# Patient Record
Sex: Female | Born: 1985 | Race: Black or African American | Hispanic: No | Smoking: Never smoker
Health system: Southern US, Community
[De-identification: ages and names within clinical notes are randomized; demographics above are authoritative.]

## PROBLEM LIST (undated history)

## (undated) DIAGNOSIS — J45909 Unspecified asthma, uncomplicated: Secondary | ICD-10-CM

## (undated) DIAGNOSIS — I10 Essential (primary) hypertension: Secondary | ICD-10-CM

---

## 2010-11-15 ENCOUNTER — Ambulatory Visit (INDEPENDENT_AMBULATORY_CARE_PROVIDER_SITE_OTHER): Payer: Medicaid Other

## 2010-11-15 ENCOUNTER — Inpatient Hospital Stay (INDEPENDENT_AMBULATORY_CARE_PROVIDER_SITE_OTHER)
Admission: RE | Admit: 2010-11-15 | Discharge: 2010-11-15 | Disposition: A | Discharge status: Home / Self Care | Payer: Self-pay | Source: Ambulatory Visit | Attending: Family Medicine | Admitting: Family Medicine

## 2010-11-15 DIAGNOSIS — J40 Bronchitis, not specified as acute or chronic: Secondary | ICD-10-CM

## 2011-02-12 ENCOUNTER — Inpatient Hospital Stay (INDEPENDENT_AMBULATORY_CARE_PROVIDER_SITE_OTHER)
Admission: RE | Admit: 2011-02-12 | Discharge: 2011-02-12 | Disposition: A | Discharge status: Home / Self Care | Payer: Medicaid Other | Source: Ambulatory Visit | Attending: Family Medicine | Admitting: Family Medicine

## 2011-02-12 DIAGNOSIS — R0789 Other chest pain: Secondary | ICD-10-CM

## 2011-12-17 ENCOUNTER — Encounter (HOSPITAL_COMMUNITY): Payer: Self-pay | Admitting: *Deleted

## 2011-12-17 ENCOUNTER — Emergency Department (HOSPITAL_COMMUNITY): Payer: Medicaid Other

## 2011-12-17 ENCOUNTER — Emergency Department (HOSPITAL_COMMUNITY)
Admission: EM | Admit: 2011-12-17 | Discharge: 2011-12-17 | Disposition: A | Discharge status: Home / Self Care | Payer: Medicaid Other | Attending: Emergency Medicine | Admitting: Emergency Medicine

## 2011-12-17 DIAGNOSIS — J705 Respiratory conditions due to smoke inhalation: Secondary | ICD-10-CM | POA: Insufficient documentation

## 2011-12-17 DIAGNOSIS — R071 Chest pain on breathing: Secondary | ICD-10-CM | POA: Insufficient documentation

## 2011-12-17 DIAGNOSIS — I10 Essential (primary) hypertension: Secondary | ICD-10-CM | POA: Insufficient documentation

## 2011-12-17 DIAGNOSIS — G8929 Other chronic pain: Secondary | ICD-10-CM | POA: Insufficient documentation

## 2011-12-17 HISTORY — DX: Essential (primary) hypertension: I10

## 2011-12-17 MED ORDER — IBUPROFEN 800 MG PO TABS: 800.0000 mg | ORAL_TABLET | Freq: Three times a day (TID) | ORAL | Status: AC

## 2011-12-17 MED ORDER — IBUPROFEN 800 MG PO TABS
800.0000 mg | ORAL_TABLET | Freq: Once | ORAL | Status: AC
  Administered 2011-12-17: 800 mg via ORAL
  Filled 2011-12-17: qty 1
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
## 2011-12-19 NOTE — ED Provider Notes (Signed)
Medical screening examination/treatment/procedure(s) were performed by non-physician practitioner and as supervising physician I was immediately available for consultation/collaboration.  Juliet Rude. Rubin Payor, MD 12/19/11 1610

## 2012-09-25 ENCOUNTER — Encounter (HOSPITAL_COMMUNITY): Payer: Self-pay | Admitting: Emergency Medicine

## 2012-09-25 ENCOUNTER — Emergency Department (HOSPITAL_COMMUNITY)
Admission: EM | Admit: 2012-09-25 | Discharge: 2012-09-25 | Disposition: A | Discharge status: Home / Self Care | Payer: Managed Care, Other (non HMO) | Source: Home / Self Care | Attending: Family Medicine | Admitting: Family Medicine

## 2012-09-25 DIAGNOSIS — K219 Gastro-esophageal reflux disease without esophagitis: Secondary | ICD-10-CM

## 2012-09-25 MED ORDER — OMEPRAZOLE-SODIUM BICARBONATE 40-1100 MG PO CAPS: 1.0000 | ORAL_CAPSULE | Freq: Every day | ORAL | Status: DC

## 2012-09-25 MED ORDER — SUCRALFATE 1 GM/10ML PO SUSP
1.0000 g | Freq: Four times a day (QID) | ORAL | Status: DC
Start: 2012-09-25 — End: 2014-02-04

## 2012-09-25 MED ORDER — GI COCKTAIL ~~LOC~~
ORAL | Status: AC
  Filled 2012-09-25: qty 30

## 2012-09-25 MED ORDER — GI COCKTAIL ~~LOC~~
30.0000 mL | Freq: Once | ORAL | Status: AC
  Administered 2012-09-25: 30 mL via ORAL

## 2012-09-25 NOTE — ED Provider Notes (Signed)
History     CSN: [id number]  Arrival date & time 09/25/12  1020   First MD Initiated Contact with Patient 09/25/12 1110      Chief Complaint  Patient presents with  . Abdominal Pain     Patient is a 27 y.o. female presenting with abdominal pain. The history is provided by the patient.  Abdominal Pain Pain location:  Epigastric Pain quality: burning   Pain radiates to:  Chest Pain severity:  Moderate Onset quality:  Gradual Timing:  Intermittent Progression:  Unchanged Chronicity:  Chronic Context: eating   Context: not alcohol use, not awakening from sleep, not laxative use, not medication withdrawal, not previous surgeries, not recent sexual activity, not recent travel, not retching, not sick contacts and not trauma   Relieved by:  Nothing Worsened by:  Eating Ineffective treatments:  NSAIDs Associated symptoms: no anorexia, no belching, no chills, no constipation, no cough, no nausea, no shortness of breath and no vomiting   Pt reports 3 yr h/o upper abd pain that she describes as sharp, burning pain that is worse after eating and if her stomach gets very empty. Denies n/v/d or fever. States she has been placed on numerous medications (Protonix, Pepcid, Omeprazole) for this problem and nothing has helped very much. States the pain radiates into her chest at times. Denies SOB or other associared symptoms.   Past Medical History  Diagnosis Date  . Hypertension     History reviewed. No pertinent past surgical history.  No family history on file.  History  Substance Use Topics  . Smoking status: Never Smoker   . Smokeless tobacco: Not on file  . Alcohol Use: No    OB History   Grav Para Term Preterm Abortions TAB SAB Ect Mult Living                  Review of Systems  Constitutional: Negative.  Negative for chills.  HENT: Negative.   Eyes: Negative.   Respiratory: Negative.  Negative for cough and shortness of breath.   Gastrointestinal: Positive for abdominal  pain. Negative for nausea, vomiting, constipation, abdominal distention and anorexia.  Endocrine: Negative.   Genitourinary: Negative.   Musculoskeletal: Negative.   Skin: Negative.   Allergic/Immunologic: Negative.   Neurological: Negative.   Hematological: Negative.   Psychiatric/Behavioral: Negative.     Allergies  Review of patient's allergies indicates no known allergies.  Home Medications   Current Outpatient Rx  Name  Route  Sig  Dispense  Refill  . omeprazole (PRILOSEC) 20 MG capsule   Oral   Take 20 mg by mouth daily.         Marland Kitchen omeprazole-sodium bicarbonate (ZEGERID) 40-1100 MG per capsule   Oral   Take 1 capsule by mouth daily before breakfast.   30 capsule   1   . pantoprazole (PROTONIX) 20 MG tablet   Oral   Take 20 mg by mouth daily.         . sucralfate (CARAFATE) 1 GM/10ML suspension   Oral   Take 10 mLs (1 g total) by mouth 4 (four) times daily.   420 mL   0     BP 119/81  Pulse 79  Temp(Src) 97.9 F (36.6 C) (Oral)  Resp 18  SpO2 100%  LMP 08/25/2012  Physical Exam  Nursing note and vitals reviewed. Constitutional: She is oriented to person, place, and time. She appears well-developed and well-nourished.  HENT:  Head: Normocephalic and atraumatic.  Eyes: Conjunctivae are  normal.  Cardiovascular: Normal rate and regular rhythm.   Pulmonary/Chest: Effort normal and breath sounds normal.  Abdominal: Soft. Bowel sounds are normal. She exhibits no distension. There is no tenderness. There is no rebound and no guarding.  Musculoskeletal: Normal range of motion.  Neurological: She is alert and oriented to person, place, and time.  Skin: Skin is warm and dry.  Psychiatric: She has a normal mood and affect.    ED Course  Procedures (including critical care time)  Labs Reviewed - No data to display No results found.   1. Gastro-esophageal reflux       MDM  Patient with approximately 3 year history of GERD-like symptoms. Has tried  numerous medications both prescription and over-the-counter for symptoms without much relief. Reports burning-like epigastric area pain that is worse after eating or when stomach is very empty. Abd exam unremarkable. Will place patient on Carafate suspension 4 times a day as well as Zegerid daily. GERD related diet information provided. GI referral provided for followup if symptoms persist. Patient verbalizes understanding and is agreeable we'll plan to        Leanne Chang, NP 09/25/12 1313

## 2012-09-25 NOTE — ED Notes (Addendum)
Pt c/o epigastric pain onset 2 yrs Sx include constant chest pain and SOB Pain increases w/activity Hx of GERD and BP... Does not remember the name of her BP meds.  Denies: f/v/n/d  She is alert and oriented w/no signs of acute distress.

## 2012-09-25 NOTE — ED Notes (Signed)
Patient seen by ramon, cma

## 2012-09-25 NOTE — ED Notes (Signed)
Mother and child being treated in the same treatment room 

## 2012-09-28 NOTE — ED Provider Notes (Signed)
Medical screening examination/treatment/procedure(s) were performed by resident physician or non-physician practitioner and as supervising physician I was immediately available for consultation/collaboration.   Jakyrah Holladay DOUGLAS MD.   Ensley Blas D Deniz Hannan, MD 09/28/12 2041 

## 2012-11-12 ENCOUNTER — Other Ambulatory Visit: Payer: Self-pay | Admitting: Internal Medicine

## 2012-11-12 DIAGNOSIS — K219 Gastro-esophageal reflux disease without esophagitis: Secondary | ICD-10-CM

## 2012-11-20 ENCOUNTER — Other Ambulatory Visit: Payer: Self-pay

## 2013-09-22 ENCOUNTER — Ambulatory Visit: Payer: Self-pay | Admitting: Obstetrics & Gynecology

## 2014-02-04 ENCOUNTER — Encounter (HOSPITAL_COMMUNITY): Payer: Self-pay | Admitting: Emergency Medicine

## 2014-02-04 ENCOUNTER — Emergency Department (HOSPITAL_COMMUNITY)
Admission: EM | Admit: 2014-02-04 | Discharge: 2014-02-04 | Disposition: A | Discharge status: Home / Self Care | Payer: BC Managed Care – PPO | Attending: Emergency Medicine | Admitting: Emergency Medicine

## 2014-02-04 ENCOUNTER — Emergency Department (HOSPITAL_COMMUNITY): Payer: BC Managed Care – PPO

## 2014-02-04 DIAGNOSIS — Z3202 Encounter for pregnancy test, result negative: Secondary | ICD-10-CM | POA: Insufficient documentation

## 2014-02-04 DIAGNOSIS — M7989 Other specified soft tissue disorders: Secondary | ICD-10-CM | POA: Insufficient documentation

## 2014-02-04 DIAGNOSIS — Z79899 Other long term (current) drug therapy: Secondary | ICD-10-CM | POA: Diagnosis not present

## 2014-02-04 DIAGNOSIS — R079 Chest pain, unspecified: Secondary | ICD-10-CM | POA: Insufficient documentation

## 2014-02-04 DIAGNOSIS — M940 Chondrocostal junction syndrome [Tietze]: Secondary | ICD-10-CM | POA: Insufficient documentation

## 2014-02-04 DIAGNOSIS — I10 Essential (primary) hypertension: Secondary | ICD-10-CM | POA: Insufficient documentation

## 2014-02-04 LAB — BASIC METABOLIC PANEL
Anion gap: 12 (ref 5–15)
BUN: 13 mg/dL (ref 6–23)
CHLORIDE: 104 meq/L (ref 96–112)
CO2: 23 mEq/L (ref 19–32)
Calcium: 8.8 mg/dL (ref 8.4–10.5)
Creatinine, Ser: 0.69 mg/dL (ref 0.50–1.10)
GFR calc non Af Amer: >90 mL/min (ref >90)
GLUCOSE: 125 mg/dL — AB (ref 70–99)
POTASSIUM: 3.6 meq/L — AB (ref 3.7–5.3)
Sodium: 139 mEq/L (ref 137–147)

## 2014-02-04 LAB — CBC
HEMATOCRIT: 39.3 % (ref 36.0–46.0)
HEMOGLOBIN: 13.4 g/dL (ref 12.0–15.0)
MCH: 27.7 pg (ref 26.0–34.0)
MCHC: 34.1 g/dL (ref 30.0–36.0)
MCV: 81.2 fL (ref 78.0–100.0)
Platelets: 233 10*3/uL (ref 150–400)
RBC: 4.84 MIL/uL (ref 3.87–5.11)
RDW: 12.5 % (ref 11.5–15.5)
WBC: 5.7 10*3/uL (ref 4.0–10.5)

## 2014-02-04 LAB — I-STAT TROPONIN, ED: Troponin i, poc: 0 ng/mL (ref 0.00–0.08)

## 2014-02-04 LAB — POC URINE PREG, ED: PREG TEST UR: NEGATIVE

## 2014-02-04 MED ORDER — KETOROLAC TROMETHAMINE 10 MG PO TABS
10.0000 mg | ORAL_TABLET | Freq: Once | ORAL | Status: AC
  Administered 2014-02-04: 10 mg via ORAL
  Filled 2014-02-04: qty 1

## 2014-02-04 NOTE — ED Notes (Signed)
Pt placed into room pt changing into gown. Nurse made aware

## 2014-02-04 NOTE — Discharge Instructions (Signed)
If your chest pain gets worse or is accompanied by shortness of breath or any other symptoms, please return to the emergency department.    Costochondritis Costochondritis is a condition in which the tissue (cartilage) that connects your ribs with your breastbone (sternum) becomes irritated. It causes pain in the chest and rib area. It usually goes away on its own over time. HOME CARE  Avoid activities that wear you out.  Do not strain your ribs. Avoid activities that use your:  Chest.  Belly.  Side muscles.  Put ice on the area for the first 2 days after the pain starts.  Put ice in a plastic bag.  Place a towel between your skin and the bag.  Leave the ice on for 20 minutes, 2-3 times a day.  Only take medicine as told by your doctor. GET HELP IF:  You have redness or puffiness (swelling) in the rib area.  Your pain does not go away with rest or medicine. GET HELP RIGHT AWAY IF:   Your pain gets worse.  You are very uncomfortable.  You have trouble breathing.  You cough up blood.  You start sweating or throwing up (vomiting).  You have a fever or lasting symptoms for more than 2-3 days.  You have a fever and your symptoms suddenly get worse. MAKE SURE YOU:   Understand these instructions.  Will watch your condition.  Will get help right away if you are not doing well or get worse. Document Released: 11/13/2007 Document Revised: 01/27/2013 Document Reviewed: 12/29/2012 [facility] Patient Information 2015 [place], [place]. This information is not intended to replace advice given to you by your health care provider. Make sure you discuss any questions you have with your health care provider.
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
## 2014-02-04 NOTE — ED Provider Notes (Signed)
This patient was seen in conjunction with the resident physician. The documentation accurately reflects the patient's encounter in the emergency department. On my evaluation, patient was in no distress, sitting upright, smiling.  Patient's evaluation here was largely reassuring, and given the chronicity of her pain, and her reassuring findings, she is discharged in stable condition to follow up with primary care  EKG shows rate 103, tachycardia, minimal voltage criteria for LVH, otherwise unremarkable.  Gerhard Munch, MD 02/04/14 [phone]
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
## 2015-07-08 IMAGING — CR DG CHEST 2V
2 series · 2 of 2 positions shown · non-contrast
Comparison: 12/17/2011

CLINICAL DATA: Chest pain

EXAM:
CHEST  2 VIEW

[w chest pa]
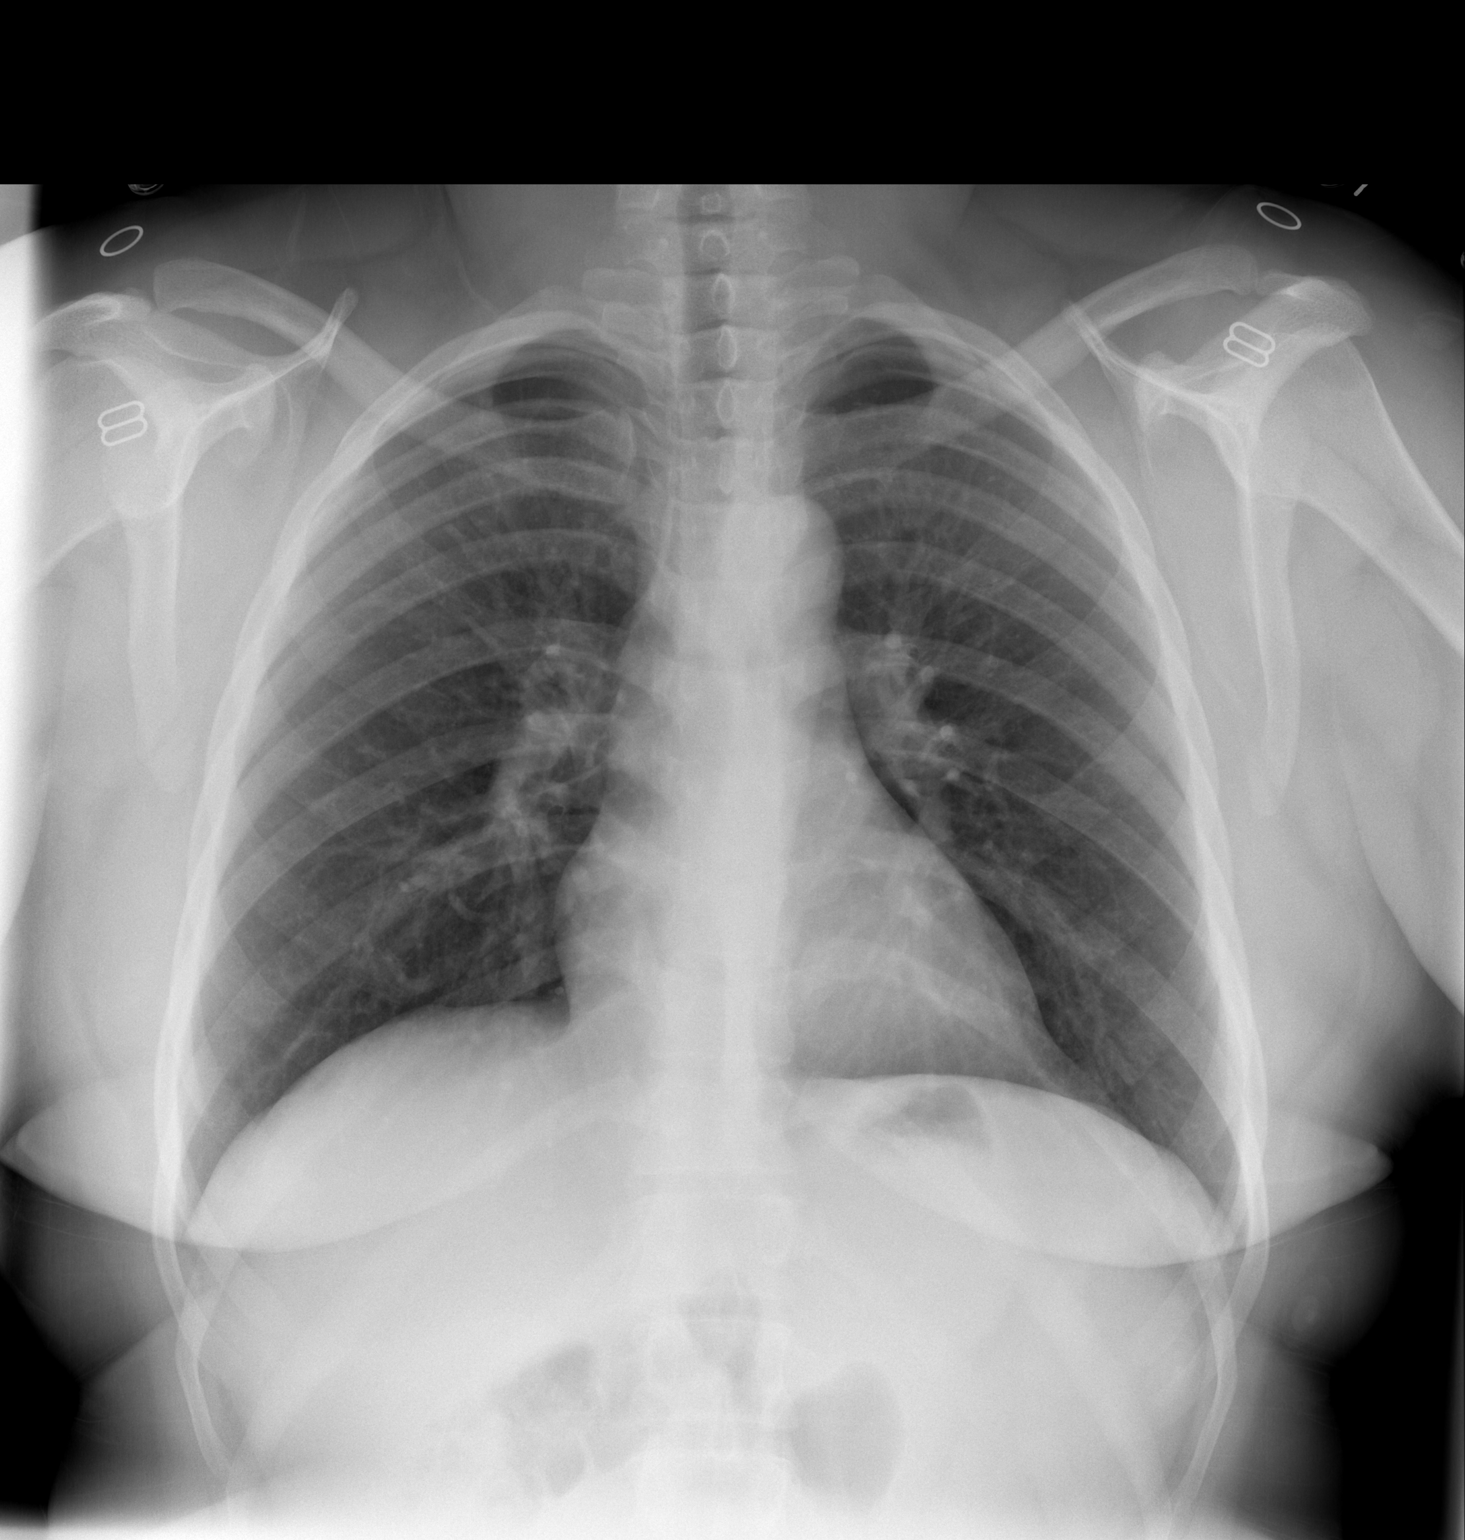

[w chest lat]
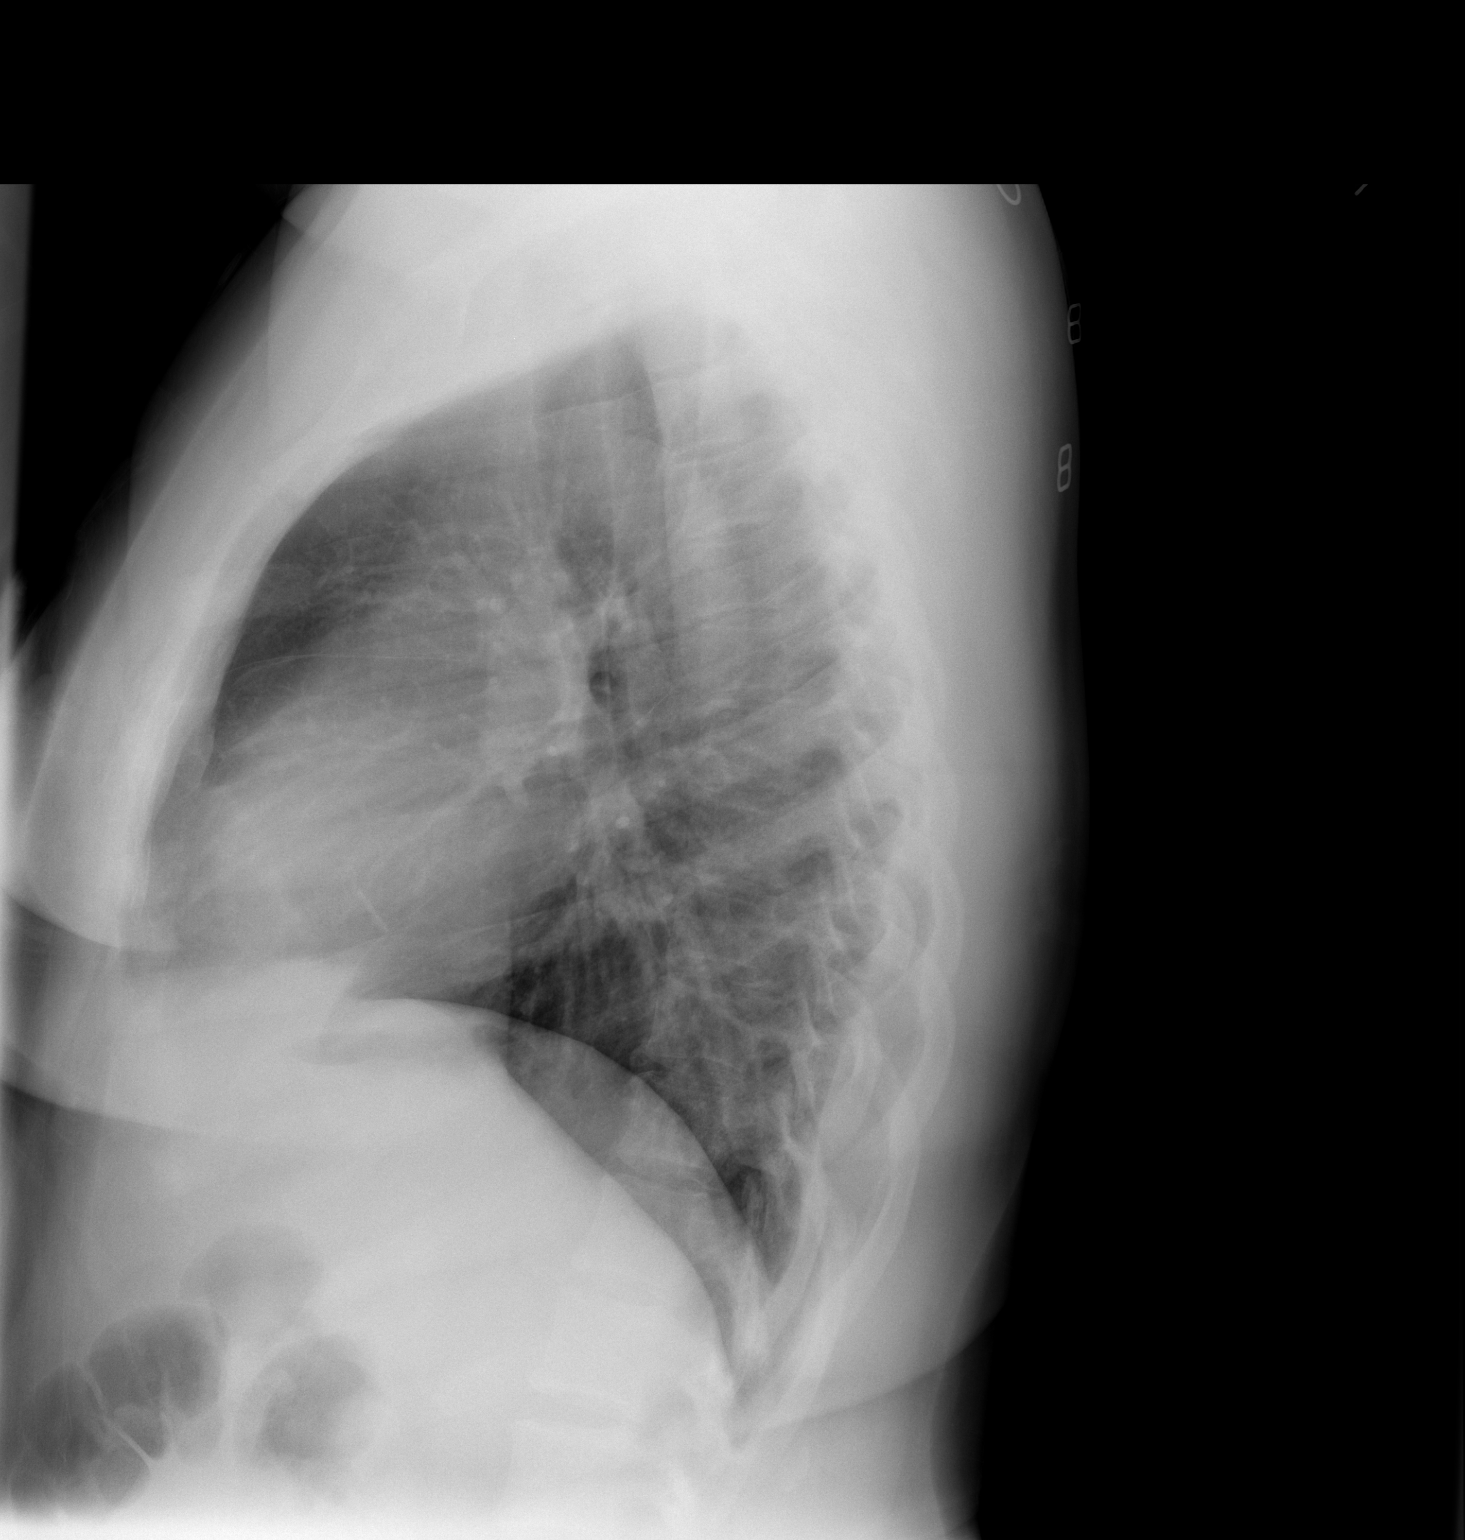

[2 of 2 positions shown; findings below may reference images not displayed]

FINDINGS: The heart size and mediastinal contours are within normal limits.
Both lungs are clear. The visualized skeletal structures are
unremarkable.
IMPRESSION: No active cardiopulmonary disease.

## 2017-08-26 ENCOUNTER — Encounter (HOSPITAL_COMMUNITY): Payer: Self-pay | Admitting: Emergency Medicine

## 2017-08-26 ENCOUNTER — Ambulatory Visit (HOSPITAL_COMMUNITY)
Admission: EM | Admit: 2017-08-26 | Discharge: 2017-08-26 | Disposition: A | Discharge status: Home / Self Care | Payer: BLUE CROSS/BLUE SHIELD | Attending: Family Medicine | Admitting: Family Medicine

## 2017-08-26 DIAGNOSIS — S39012A Strain of muscle, fascia and tendon of lower back, initial encounter: Secondary | ICD-10-CM | POA: Diagnosis not present

## 2017-08-26 DIAGNOSIS — Z3202 Encounter for pregnancy test, result negative: Secondary | ICD-10-CM

## 2017-08-26 DIAGNOSIS — M545 Low back pain: Secondary | ICD-10-CM | POA: Diagnosis not present

## 2017-08-26 LAB — POCT URINALYSIS DIP (DEVICE)
Bilirubin Urine: NEGATIVE
Glucose, UA: NEGATIVE mg/dL
Hgb urine dipstick: NEGATIVE
KETONES UR: NEGATIVE mg/dL
Leukocytes, UA: NEGATIVE
Nitrite: NEGATIVE
PH: 7 (ref 5.0–8.0)
PROTEIN: NEGATIVE mg/dL
Specific Gravity, Urine: 1.02 (ref 1.005–1.030)
Urobilinogen, UA: 0.2 mg/dL (ref 0.0–1.0)

## 2017-08-26 LAB — POCT PREGNANCY, URINE: PREG TEST UR: NEGATIVE

## 2017-08-26 MED ORDER — NAPROXEN 500 MG PO TABS: 500.0000 mg | ORAL_TABLET | Freq: Two times a day (BID) | ORAL | 0 refills | Status: AC

## 2017-08-26 MED ORDER — KETOROLAC TROMETHAMINE 60 MG/2ML IM SOLN: 60.0000 mg | Freq: Once | INTRAMUSCULAR | Status: DC

## 2017-08-26 NOTE — Discharge Instructions (Signed)
Start naproxen tonight, then take twice a day for pain. Take with food. Light and regular activity. See exercises provided to initiate as able.  Please establish with a primary care provider as may need long term management.

## 2017-08-26 NOTE — ED Triage Notes (Signed)
Pt c/o back pain, pt also states the bottom of her feet hurt and her knees hurt. Pt has center mid back pain, started Sunday night. Hurts worse when bending over.

## 2017-08-26 NOTE — ED Provider Notes (Signed)
[facility]    CSN: [id number] Arrival date & time: 08/26/17  1149     History   Chief Complaint Chief Complaint  Patient presents with  . Back Pain    HPI Allison Roman is a 32 y.o. female.   Allison Roman presents with complaints of midline low back pain which started two days ago. No known injury or exacerbating activity. States has had similar approximately 2 years ago which resolved. Denies urinary symptoms, nausea, vomiting or diarrhea. Pain is worse with activity, flexion/extension of back or laying flat. Took ibuprofen yesterday but has not taken today. Pain is mildly improved today. Without radiation of pain to buttocks or legs. Hx of htn but otherwise without contributing medical history.   ROS per HPI.       Past Medical History:  Diagnosis Date  . Hypertension     There are no active problems to display for this patient.   History reviewed. No pertinent surgical history.  OB History    No data available       Home Medications    Prior to Admission medications   Medication Sig Start Date End Date Taking? Authorizing Provider  Etonogestrel (NEXPLANON [place]) Inject into the skin.   Yes [provider]  naproxen (NAPROSYN) 500 MG tablet Take 1 tablet (500 mg total) by mouth 2 (two) times daily. 08/26/17   Georgetta Haber, NP    Family History No family history on file.  Social History Social History   Tobacco Use  . Smoking status: Never Smoker  Substance Use Topics  . Alcohol use: No  . Drug use: No     Allergies   Patient has no known allergies.   Review of Systems Review of Systems   Physical Exam Triage Vital Signs ED Triage Vitals  Enc Vitals Group     BP 08/26/17 1226 (!) 150/79     Pulse Rate 08/26/17 1226 82     Resp 08/26/17 1226 16     Temp 08/26/17 1226 97.9 F (36.6 C)     Temp src --      SpO2 08/26/17 1226 98 %     Weight --      Height --      Head Circumference --      Peak Flow --      Pain  Score 08/26/17 1227 9     Pain Loc --      Pain Edu? --      Excl. in GC? --    No data found.  Updated Vital Signs BP (!) 150/79   Pulse 82   Temp 97.9 F (36.6 C)   Resp 16   LMP 07/29/2017   SpO2 98%   Visual Acuity Right Eye Distance:   Left Eye Distance:   Bilateral Distance:    Right Eye Near:   Left Eye Near:    Bilateral Near:     Physical Exam  Constitutional: She is oriented to person, place, and time. She appears well-developed and well-nourished. No distress.  Cardiovascular: Normal rate, regular rhythm and normal heart sounds.  Pulmonary/Chest: Effort normal and breath sounds normal.  Musculoskeletal:       Lumbar back: She exhibits tenderness, pain and spasm. She exhibits normal range of motion and normal pulse.       Back:  Midline low back pain, tenderness on palpation without stepoff; musculature pain with straight leg raise and with back flexion; pain with laying flat on exam table;  sensation intact; strength equal to bilateral lower extremities; without pain with heel or toe touch ambulation  Neurological: She is alert and oriented to person, place, and time.  Skin: Skin is warm and dry.     UC Treatments / Results  Labs (all labs ordered are listed, but only abnormal results are displayed) Labs Reviewed  POCT URINALYSIS DIP (DEVICE)  POCT PREGNANCY, URINE    EKG  EKG Interpretation None       Radiology No results found.  Procedures Procedures (including critical care time)  Medications Ordered in UC Medications  ketorolac (TORADOL) injection 60 mg (not administered)     Initial Impression / Assessment and Plan / UC Course  I have reviewed the triage vital signs and the nursing notes.  Pertinent labs & imaging results that were available during my care of the patient were reviewed by me and considered in my medical decision making (see chart for details).     Without trauma or injury; pain is improved since yesterday.  Naproxen twice a day for pain control, exercises for provided for strengthening. Patient verbalized understanding and agreeable to plan.  Ambulatory out of clinic without difficulty.    Final Clinical Impressions(s) / UC Diagnoses   Final diagnoses:  Strain of lumbar region, initial encounter    ED Discharge Orders        Ordered    naproxen (NAPROSYN) 500 MG tablet  2 times daily     08/26/17 1308       Controlled Substance Prescriptions [place] Controlled Substance Registry consulted? Not Applicable   Georgetta HaberBurky, Natalie B, NP 08/26/17 1328

## 2018-05-11 ENCOUNTER — Other Ambulatory Visit: Payer: Self-pay | Admitting: Family Medicine

## 2018-05-29 ENCOUNTER — Ambulatory Visit (HOSPITAL_COMMUNITY)
Admission: EM | Admit: 2018-05-29 | Discharge: 2018-05-29 | Disposition: A | Discharge status: Home / Self Care | Payer: BLUE CROSS/BLUE SHIELD | Attending: Family Medicine | Admitting: Family Medicine

## 2018-05-29 ENCOUNTER — Encounter (HOSPITAL_COMMUNITY): Payer: Self-pay

## 2018-05-29 DIAGNOSIS — J4521 Mild intermittent asthma with (acute) exacerbation: Secondary | ICD-10-CM | POA: Insufficient documentation

## 2018-05-29 MED ORDER — PREDNISONE 20 MG PO TABS: ORAL_TABLET | ORAL | 0 refills | Status: DC

## 2018-05-29 MED ORDER — ALBUTEROL SULFATE HFA 108 (90 BASE) MCG/ACT IN AERS: 2.0000 | INHALATION_SPRAY | RESPIRATORY_TRACT | 1 refills | Status: DC | PRN

## 2018-05-29 MED ORDER — SPACER/AERO-HOLDING CHAMBERS DEVI: 1.0000 | Freq: Once | 1 refills | Status: AC

## 2018-05-29 NOTE — ED Notes (Signed)
Patient able to ambulate independently  

## 2018-05-29 NOTE — ED Provider Notes (Signed)
[facility]    CSN: [id number] Arrival date & time: 05/29/18  1717     History   Chief Complaint Chief Complaint  Patient presents with  . Cough  . Shortness of Breath    HPI Allison Roman is a 32 y.o. female.   32 year old established patient here at [facility] urgent care who presents with  coughing with shortness of breath, symptoms started 3 days ago.  She has had this once before.  Patient denies fever.  Patient says that symptoms are worse at night.  She has had this symptom once before.  Patient is from [place] and never had this in [place].     Past Medical History:  Diagnosis Date  . Hypertension     There are no active problems to display for this patient.   History reviewed. No pertinent surgical history.  OB History   No obstetric history on file.      Home Medications    Prior to Admission medications   Medication Sig Start Date End Date Taking? Authorizing Provider  albuterol (PROVENTIL HFA;VENTOLIN HFA) 108 (90 Base) MCG/ACT inhaler Inhale 2 puffs into the lungs every 4 (four) hours as needed for wheezing or shortness of breath (cough, shortness of breath or wheezing.). 05/29/18   Elvina SidleLauenstein, Bellina Tokarczyk, MD  Etonogestrel [facility](NEXPLANON [place]) Inject into the skin.    [provider]  naproxen (NAPROSYN) 500 MG tablet Take 1 tablet (500 mg total) by mouth 2 (two) times daily. 08/26/17   Georgetta HaberBurky, Natalie B, NP  predniSONE (DELTASONE) 20 MG tablet Two daily with food 05/29/18   Elvina SidleLauenstein, Jahmez Bily, MD  Spacer/Aero-Holding Chambers DEVI 1 Device by Does not apply route once for 1 dose. 05/29/18 05/29/18  Elvina SidleLauenstein, Thelda Gagan, MD    Family History History reviewed. No pertinent family history.  Social History Social History   Tobacco Use  . Smoking status: Never Smoker  . Smokeless tobacco: Never Used  Substance Use Topics  . Alcohol use: No  . Drug use: No     Allergies   Patient has no known allergies.   Review of Systems Review of  Systems   Physical Exam Triage Vital Signs ED Triage Vitals [05/29/18 1748]  Enc Vitals Group     BP (!) 153/111     Pulse Rate (!) 107     Resp 20     Temp 97.9 F (36.6 C)     Temp Source Oral     SpO2 95 %     Weight      Height      Head Circumference      Peak Flow      Pain Score 4     Pain Loc      Pain Edu?      Excl. in GC?    No data found.  Updated Vital Signs BP (!) 153/111 (BP Location: Right Arm)   Pulse (!) 107   Temp 97.9 F (36.6 C) (Oral)   Resp 20   LMP 05/15/2018   SpO2 95%    Physical Exam Vitals signs and nursing note reviewed.  Constitutional:      Appearance: She is well-developed.  HENT:     Head: Normocephalic.     Mouth/Throat:     Mouth: Mucous membranes are moist.  Eyes:     Pupils: Pupils are equal, round, and reactive to light.  Neck:     Musculoskeletal: Normal range of motion and neck supple.  Pulmonary:  Effort: Pulmonary effort is normal.     Breath sounds: Examination of the right-upper field reveals wheezing. Examination of the left-upper field reveals wheezing. Examination of the right-middle field reveals wheezing. Examination of the left-middle field reveals wheezing. Examination of the right-lower field reveals wheezing. Examination of the left-lower field reveals wheezing. Wheezing present.  Musculoskeletal: Normal range of motion.  Skin:    General: Skin is warm and dry.  Neurological:     General: No focal deficit present.     Mental Status: She is alert.  Psychiatric:        Mood and Affect: Mood normal.        Behavior: Behavior normal.      UC Treatments / Results  Labs (all labs ordered are listed, but only abnormal results are displayed) Labs Reviewed - No data to display  EKG None  Radiology No results found.  Procedures Procedures (including critical care time)  Medications Ordered in UC Medications - No data to display  Initial Impression / Assessment and Plan / UC Course  I have  reviewed the triage vital signs and the nursing notes.  Pertinent labs & imaging results that were available during my care of the patient were reviewed by me and considered in my medical decision making (see chart for details).    Final Clinical Impressions(s) / UC Diagnoses   Final diagnoses:  Mild intermittent asthma with exacerbation   Discharge Instructions   None    ED Prescriptions    Medication Sig Dispense Auth. Provider   predniSONE (DELTASONE) 20 MG tablet Two daily with food 10 tablet Elvina SidleLauenstein, Zev Blue, MD   albuterol (PROVENTIL HFA;VENTOLIN HFA) 108 (90 Base) MCG/ACT inhaler Inhale 2 puffs into the lungs every 4 (four) hours as needed for wheezing or shortness of breath (cough, shortness of breath or wheezing.). 1 Inhaler Elvina SidleLauenstein, Albertine Lafoy, MD   Spacer/Aero-Holding Chambers DEVI 1 Device by Does not apply route once for 1 dose. 1 each Elvina SidleLauenstein, Ned Kakar, MD     Controlled Substance Prescriptions [place] Controlled Substance Registry consulted? No   Elvina SidleLauenstein, Karoline Fleer, MD 05/29/18 [phone]

## 2018-05-29 NOTE — ED Triage Notes (Signed)
Pt present coughing with shortness of breath, symptoms started 3 days ago.

## 2018-09-23 ENCOUNTER — Encounter (HOSPITAL_COMMUNITY): Payer: Self-pay

## 2018-09-23 ENCOUNTER — Ambulatory Visit (HOSPITAL_COMMUNITY)
Admission: EM | Admit: 2018-09-23 | Discharge: 2018-09-23 | Disposition: A | Discharge status: Home / Self Care | Payer: Self-pay | Attending: Family Medicine | Admitting: Family Medicine

## 2018-09-23 ENCOUNTER — Other Ambulatory Visit: Payer: Self-pay

## 2018-09-23 DIAGNOSIS — J4541 Moderate persistent asthma with (acute) exacerbation: Secondary | ICD-10-CM

## 2018-09-23 HISTORY — DX: Unspecified asthma, uncomplicated: J45.909

## 2018-09-23 MED ORDER — ALBUTEROL SULFATE HFA 108 (90 BASE) MCG/ACT IN AERS
INHALATION_SPRAY | RESPIRATORY_TRACT | Status: AC
  Filled 2018-09-23: qty 6.7

## 2018-09-23 MED ORDER — ALBUTEROL SULFATE HFA 108 (90 BASE) MCG/ACT IN AERS
2.0000 | INHALATION_SPRAY | Freq: Once | RESPIRATORY_TRACT | Status: AC
  Administered 2018-09-23: 2 via RESPIRATORY_TRACT

## 2018-09-23 MED ORDER — METHYLPREDNISOLONE SODIUM SUCC 125 MG IJ SOLR
125.0000 mg | Freq: Once | INTRAMUSCULAR | Status: AC
  Administered 2018-09-23: 125 mg via INTRAMUSCULAR

## 2018-09-23 MED ORDER — METHYLPREDNISOLONE SODIUM SUCC 125 MG IJ SOLR
INTRAMUSCULAR | Status: AC
  Filled 2018-09-23: qty 2

## 2018-09-23 MED ORDER — PREDNISONE 20 MG PO TABS: 20.0000 mg | ORAL_TABLET | Freq: Two times a day (BID) | ORAL | 0 refills | Status: AC

## 2018-09-23 NOTE — ED Provider Notes (Signed)
[facility]   [id number] 09/23/18 Arrival Time: 1642  Cc: COUGH  SUBJECTIVE:  Allison Roman is a 33 y.o. female hx significant for asthma, and HTN, who presents with asthma flare that began on 08/30/2018.  Denies precipitating event or association with the season change.  Complains of chest tightness, SOB, and wheezing.  Ran out her inhaler.  Reports previous symptoms in the past that improved with inhaler use.   Complains of mild nasal congestion.  Denies fever, chills, fatigue, sinus pain, rhinorrhea, sore throat, chest pain, nausea, changes in bowel or bladder habits.    ROS: As per HPI.  Past Medical History:  Diagnosis Date  . Asthma   . Hypertension    History reviewed. No pertinent surgical history. No Known Allergies No current facility-administered medications on file prior to encounter.    Current Outpatient Medications on File Prior to Encounter  Medication Sig Dispense Refill  . albuterol (PROVENTIL HFA;VENTOLIN HFA) 108 (90 Base) MCG/ACT inhaler Inhale 2 puffs into the lungs every 4 (four) hours as needed for wheezing or shortness of breath (cough, shortness of breath or wheezing.). 1 Inhaler 1  . Etonogestrel (NEXPLANON [place]) Inject into the skin.    . naproxen (NAPROSYN) 500 MG tablet Take 1 tablet (500 mg total) by mouth 2 (two) times daily. 30 tablet 0    Social History   Socioeconomic History  . Marital status: Married    Spouse name: Not on file  . Number of children: Not on file  . Years of education: Not on file  . Highest education level: Not on file  Occupational History  . Not on file  Social Needs  . Financial resource strain: Not on file  . Food insecurity:    Worry: Not on file    Inability: Not on file  . Transportation needs:    Medical: Not on file    Non-medical: Not on file  Tobacco Use  . Smoking status: Never Smoker  . Smokeless tobacco: Never Used  Substance and Sexual Activity  . Alcohol use: No  . Drug use: No  . Sexual  activity: Not on file  Lifestyle  . Physical activity:    Days per week: Not on file    Minutes per session: Not on file  . Stress: Not on file  Relationships  . Social connections:    Talks on phone: Not on file    Gets together: Not on file    Attends religious service: Not on file    Active member of club or organization: Not on file    Attends meetings of clubs or organizations: Not on file    Relationship status: Not on file  . Intimate partner violence:    Fear of current or ex partner: Not on file    Emotionally abused: Not on file    Physically abused: Not on file    Forced sexual activity: Not on file  Other Topics Concern  . Not on file  Social History Narrative  . Not on file   Family History  Family history unknown: Yes     OBJECTIVE:  Vitals:   09/23/18 1752  BP: 135/90  Pulse: 91  Resp: 17  Temp: 98 F (36.7 C)  TempSrc: Oral  SpO2: 97%     General appearance: Alert, well-appearing, nontoxic; speaking in full sentences without difficulty HEENT:NCAT; Eyes: PERRL.  EOM grossly intact. Nose: nares patent without rhinorrhea, left turbinate swollen and pale; Throat: tonsils nonerythematous or enlarged, uvula  midline  Neck: supple without LAD Lungs: harsh wheezes heard throughout bilateral lungs anterior and posterior chest Heart: regular rate and rhythm.  Radial pulses 2+ symmetrical bilaterally Skin: warm and dry Psychological: alert and cooperative; normal mood and affect   ASSESSMENT & PLAN:  1. Moderate persistent asthma with exacerbation     Meds ordered this encounter  Medications  . methylPREDNISolone sodium succinate (SOLU-MEDROL) 125 mg/2 mL injection 125 mg  . albuterol (PROVENTIL HFA;VENTOLIN HFA) 108 (90 Base) MCG/ACT inhaler 2 puff  . predniSONE (DELTASONE) 20 MG tablet    Sig: Take 1 tablet (20 mg total) by mouth 2 (two) times daily with a meal for 5 days.    Dispense:  10 tablet    Refill:  0    Order Specific Question:    Supervising Provider    Answer:   Eustace MooreELSON, YVONNE SUE [[id number]]    Steroid shot given in office Inhaler given in office.  Use as needed for shortness of breath and/or wheezing Prednisone prescribed.  Take as directed and to completion Follow up with PCP or with Joaquin CourtsKimberly Harris FNP next week for recheck and to ensure your symptoms are improving Return here or go to ER if you have any new or worsening symptoms such as shortness of breath, difficulty breathing, accessory muscle use, rib retraction, if symptoms do not improve with medication, etc...  Reviewed expectations re: course of current medical issues. Questions answered. Outlined signs and symptoms indicating need for more acute intervention. Patient verbalized understanding. After Visit Summary given.          Rennis HardingWurst, Jailani Hogans, PA-C 09/23/18 1837

## 2018-09-23 NOTE — ED Triage Notes (Signed)
Pt presents with asthma flare up; chest tightness and shortness of breath.  Pt needs a new inhaler.

## 2018-09-23 NOTE — Discharge Instructions (Signed)
Steroid shot given in office Inhaler given in office.  Use as needed for shortness of breath and/or wheezing Prednisone prescribed.  Take as directed and to completion Follow up with PCP or with Joaquin Courts FNP next week for recheck and to ensure your symptoms are improving Return here or go to ER if you have any new or worsening symptoms such as shortness of breath, difficulty breathing, accessory muscle use, rib retraction, if symptoms do not improve with medication, etc..Marland Kitchen

## 2019-03-08 ENCOUNTER — Encounter (HOSPITAL_COMMUNITY): Payer: Self-pay

## 2019-03-08 ENCOUNTER — Other Ambulatory Visit: Payer: Self-pay

## 2019-03-08 ENCOUNTER — Ambulatory Visit (INDEPENDENT_AMBULATORY_CARE_PROVIDER_SITE_OTHER): Payer: Self-pay

## 2019-03-08 ENCOUNTER — Ambulatory Visit (HOSPITAL_COMMUNITY)
Admission: EM | Admit: 2019-03-08 | Discharge: 2019-03-08 | Disposition: A | Discharge status: Home / Self Care | Payer: Self-pay | Attending: Family Medicine | Admitting: Family Medicine

## 2019-03-08 DIAGNOSIS — J40 Bronchitis, not specified as acute or chronic: Secondary | ICD-10-CM

## 2019-03-08 DIAGNOSIS — I10 Essential (primary) hypertension: Secondary | ICD-10-CM

## 2019-03-08 MED ORDER — ALBUTEROL SULFATE HFA 108 (90 BASE) MCG/ACT IN AERS: 2.0000 | INHALATION_SPRAY | RESPIRATORY_TRACT | 1 refills | Status: DC | PRN

## 2019-03-08 MED ORDER — PREDNISONE 10 MG PO TABS: 40.0000 mg | ORAL_TABLET | Freq: Every day | ORAL | 0 refills | Status: AC

## 2019-03-08 NOTE — ED Triage Notes (Signed)
Patient presents to Urgent Care with complaints of SOB since last night. Patient reports she has asthma, has used inhalers in the past but does not have a prescription for one now, would like one, VSS.

## 2019-03-08 NOTE — Discharge Instructions (Signed)
Treating you for bronchitis use the inhaler every 4 hours as needed for cough, wheezing, shortness of breath.  Prednisone daily for the next 5 days.  Make sure you take this with food. Follow up as needed for continued or worsening symptoms

## 2019-03-08 NOTE — ED Provider Notes (Signed)
[facility]    CSN: [id number] Arrival date & time: 03/08/19  0813      History   Chief Complaint Chief Complaint  Patient presents with  . Asthma    HPI Allison Roman is a 33 y.o. female.   Pt is a 33 year old female with PMH of asthma and HTN. She presents with cough, wheezing and SOB.  Symptoms have been constant over the past couple days.  Using inhalers in the past but is currently out of her inhaler.  Some chest discomfort with inspiration and coughing.  Denies any associated fevers, chills, body aches, sore throat or ear pain.  ROS per HPI      Past Medical History:  Diagnosis Date  . Asthma   . Hypertension     There are no active problems to display for this patient.   History reviewed. No pertinent surgical history.  OB History   No obstetric history on file.      Home Medications    Prior to Admission medications   Medication Sig Start Date End Date Taking? Authorizing Provider  albuterol (VENTOLIN HFA) 108 (90 Base) MCG/ACT inhaler Inhale 2 puffs into the lungs every 4 (four) hours as needed for wheezing or shortness of breath (cough, shortness of breath or wheezing.). 03/08/19   Dahlia Byes A, NP  Etonogestrel (NEXPLANON [employer]) Inject into the skin.    [provider]  naproxen (NAPROSYN) 500 MG tablet Take 1 tablet (500 mg total) by mouth 2 (two) times daily. 08/26/17   Georgetta Haber, NP  predniSONE (DELTASONE) 10 MG tablet Take 4 tablets (40 mg total) by mouth daily for 5 days. 03/08/19 03/13/19  Janace Aris, NP    Family History Family History  Problem Relation Age of Onset  . Healthy Mother   . Healthy Father     Social History Social History   Tobacco Use  . Smoking status: Never Smoker  . Smokeless tobacco: Never Used  Substance Use Topics  . Alcohol use: No  . Drug use: No     Allergies   Patient has no known allergies.   Review of Systems Review of Systems   Physical Exam Triage Vital Signs ED  Triage Vitals  Enc Vitals Group     BP 03/08/19 0848 (!) 149/99     Pulse Rate 03/08/19 0848 84     Resp 03/08/19 0848 16     Temp 03/08/19 0848 98.2 F (36.8 C)     Temp Source 03/08/19 0848 Temporal     SpO2 03/08/19 0848 97 %     Weight --      Height --      Head Circumference --      Peak Flow --      Pain Score 03/08/19 0849 3     Pain Loc --      Pain Edu? --      Excl. in GC? --    No data found.  Updated Vital Signs BP (!) 149/99 (BP Location: Right Arm)   Pulse 84   Temp 98.2 F (36.8 C) (Temporal)   Resp 16   LMP 02/14/2019   SpO2 97%   Visual Acuity Right Eye Distance:   Left Eye Distance:   Bilateral Distance:    Right Eye Near:   Left Eye Near:    Bilateral Near:     Physical Exam Vitals signs and nursing note reviewed.  Constitutional:      General:  She is not in acute distress.    Appearance: Normal appearance. She is not ill-appearing, toxic-appearing or diaphoretic.  HENT:     Right Ear: Tympanic membrane and ear canal normal.     Left Ear: Tympanic membrane and ear canal normal.     Mouth/Throat:     Pharynx: Oropharynx is clear.  Eyes:     Conjunctiva/sclera: Conjunctivae normal.  Pulmonary:     Effort: Pulmonary effort is normal.     Breath sounds: Rhonchi present.  Musculoskeletal: Normal range of motion.  Skin:    General: Skin is warm and dry.  Neurological:     Mental Status: She is alert.  Psychiatric:        Mood and Affect: Mood normal.      UC Treatments / Results  Labs (all labs ordered are listed, but only abnormal results are displayed) Labs Reviewed - No data to display  EKG   Radiology Dg Chest 2 View  Result Date: 03/08/2019 CLINICAL DATA:  Shortness of breath, chest pain EXAM: CHEST - 2 VIEW COMPARISON:  None. FINDINGS: Heart and mediastinal contours are within normal limits. No focal opacities or effusions. No acute bony abnormality. Mild peribronchial thickening. IMPRESSION: Mild bronchitic changes.  Electronically Signed   By: Rolm Baptise M.D.   On: 03/08/2019 09:29    Procedures Procedures (including critical care time)  Medications Ordered in UC Medications - No data to display  Initial Impression / Assessment and Plan / UC Course  I have reviewed the triage vital signs and the nursing notes.  Pertinent labs & imaging results that were available during my care of the patient were reviewed by me and considered in my medical decision making (see chart for details).     Bronchitis-treating with albuterol inhaler to use every 4 hours as needed for cough, wheezing, shortness of breath.  Prednisone burst over the next 5 days. Follow up as needed for continued or worsening symptoms  Final Clinical Impressions(s) / UC Diagnoses   Final diagnoses:  Bronchitis     Discharge Instructions     Treating you for bronchitis use the inhaler every 4 hours as needed for cough, wheezing, shortness of breath.  Prednisone daily for the next 5 days.  Make sure you take this with food. Follow up as needed for continued or worsening symptoms     ED Prescriptions    Medication Sig Dispense Auth. Provider   albuterol (VENTOLIN HFA) 108 (90 Base) MCG/ACT inhaler Inhale 2 puffs into the lungs every 4 (four) hours as needed for wheezing or shortness of breath (cough, shortness of breath or wheezing.). 18 g Sheryl Saintil A, NP   predniSONE (DELTASONE) 10 MG tablet Take 4 tablets (40 mg total) by mouth daily for 5 days. 20 tablet Loura Halt A, NP     PDMP not reviewed this encounter.   Loura Halt A, NP 03/08/19 [phone]

## 2019-06-13 ENCOUNTER — Ambulatory Visit (HOSPITAL_COMMUNITY)
Admission: EM | Admit: 2019-06-13 | Discharge: 2019-06-13 | Disposition: A | Discharge status: Home / Self Care | Payer: Medicaid Other | Attending: Internal Medicine | Admitting: Internal Medicine

## 2019-06-13 ENCOUNTER — Encounter (HOSPITAL_COMMUNITY): Payer: Self-pay | Admitting: Emergency Medicine

## 2019-06-13 ENCOUNTER — Other Ambulatory Visit: Payer: Self-pay

## 2019-06-13 DIAGNOSIS — J4541 Moderate persistent asthma with (acute) exacerbation: Secondary | ICD-10-CM | POA: Diagnosis present

## 2019-06-13 DIAGNOSIS — Z20822 Contact with and (suspected) exposure to covid-19: Secondary | ICD-10-CM

## 2019-06-13 DIAGNOSIS — I10 Essential (primary) hypertension: Secondary | ICD-10-CM

## 2019-06-13 MED ORDER — PREDNISONE 10 MG PO TABS: 40.0000 mg | ORAL_TABLET | Freq: Every day | ORAL | 0 refills | Status: AC

## 2019-06-13 MED ORDER — ALBUTEROL SULFATE HFA 108 (90 BASE) MCG/ACT IN AERS
2.0000 | INHALATION_SPRAY | Freq: Once | RESPIRATORY_TRACT | Status: AC
  Administered 2019-06-13: 12:00:00 2 via RESPIRATORY_TRACT

## 2019-06-13 MED ORDER — BUDESONIDE 90 MCG/ACT IN AEPB: 1.0000 | INHALATION_SPRAY | Freq: Two times a day (BID) | RESPIRATORY_TRACT | 1 refills | Status: AC

## 2019-06-13 MED ORDER — ALBUTEROL SULFATE HFA 108 (90 BASE) MCG/ACT IN AERS: 2.0000 | INHALATION_SPRAY | RESPIRATORY_TRACT | 2 refills | Status: DC | PRN

## 2019-06-13 MED ORDER — ALBUTEROL SULFATE HFA 108 (90 BASE) MCG/ACT IN AERS
INHALATION_SPRAY | RESPIRATORY_TRACT | Status: AC
  Filled 2019-06-13: qty 6.7

## 2019-06-13 NOTE — ED Provider Notes (Signed)
[facility]    CSN: [id number] Arrival date & time: 06/13/19  1024      History   Chief Complaint Chief Complaint  Patient presents with  . Shortness of Breath  . Asthma    HPI Allison Roman is a 34 y.o. female.   Patient reports to urgent care today for 2 days of worsening asthma exacerbation. Patient states she ran out of her albuterol 2 days ago. She reports using this daily up for some time. She reports being wheezy and having difficulty breath. She wakes every night with cough and difficulty breathing. She states triggers are smoke and perfumes. She denies fever, chills, runny nose, headache, chest pain. She has no known covid exposures.   She has been seen and treated in 09/2018 for asthma exacerbation and placed on albuterol and prednisone burst. She was seen in 02/2019 for bronchitis and recommended continued albuterol use and placed on prednisone burst. She reports these plans have helped in the past. She does not have a primary care provider or another provider managing her asthma but is interested in this.      Past Medical History:  Diagnosis Date  . Asthma   . Hypertension     There are no problems to display for this patient.   History reviewed. No pertinent surgical history.  OB History   No obstetric history on file.      Home Medications    Prior to Admission medications   Medication Sig Start Date End Date Taking? Authorizing Provider  albuterol (VENTOLIN HFA) 108 (90 Base) MCG/ACT inhaler Inhale 2 puffs into the lungs every 4 (four) hours as needed for wheezing or shortness of breath (cough, shortness of breath or wheezing.). 06/13/19   Cordarrel Stiefel, Veryl Speak, PA-C  Budesonide 90 MCG/ACT inhaler Inhale 1 puff into the lungs 2 (two) times daily. 06/13/19 07/13/19  Zeki Bedrosian, Veryl Speak, PA-C  Etonogestrel (NEXPLANON [place]) Inject into the skin.    [provider]  naproxen (NAPROSYN) 500 MG tablet Take 1 tablet (500 mg total) by mouth 2 (two) times daily.  08/26/17   Georgetta Haber, NP  predniSONE (DELTASONE) 10 MG tablet Take 4 tablets (40 mg total) by mouth daily for 5 days. 06/13/19 06/18/19  Raileigh Sabater, Veryl Speak, PA-C    Family History Family History  Problem Relation Age of Onset  . Healthy Mother   . Healthy Father     Social History Social History   Tobacco Use  . Smoking status: Never Smoker  . Smokeless tobacco: Never Used  Substance Use Topics  . Alcohol use: No  . Drug use: No     Allergies   Patient has no known allergies.   Review of Systems Review of Systems  Constitutional: Negative for chills and fever.  HENT: Negative for congestion, ear pain, postnasal drip, rhinorrhea, sinus pressure, sinus pain, sneezing and sore throat.   Eyes: Negative for pain and visual disturbance.  Respiratory: Positive for cough, chest tightness, shortness of breath and wheezing.   Cardiovascular: Negative for chest pain and palpitations.  Gastrointestinal: Negative for abdominal pain, diarrhea, nausea and vomiting.  Genitourinary: Negative for dysuria and hematuria.  Musculoskeletal: Negative for arthralgias, back pain and myalgias.  Skin: Negative for color change and rash.  Neurological: Negative for syncope and headaches.  All other systems reviewed and are negative.    Physical Exam Triage Vital Signs ED Triage Vitals  Enc Vitals Group     BP 06/13/19 1119 (!) 141/85  Pulse Rate 06/13/19 1119 88     Resp 06/13/19 1119 20     Temp 06/13/19 1119 98.4 F (36.9 C)     Temp Source 06/13/19 1119 Oral     SpO2 06/13/19 1119 100 %     Weight --      Height --      Head Circumference --      Peak Flow --      Pain Score 06/13/19 1115 8     Pain Loc --      Pain Edu? --      Excl. in [place]? --    No data found.  Updated Vital Signs BP (!) 141/85 (BP Location: Left Arm)   Pulse 88   Temp 98.4 F (36.9 C) (Oral)   Resp 20   LMP 05/16/2019   SpO2 100%   Visual Acuity Right Eye Distance:   Left Eye Distance:     Bilateral Distance:    Right Eye Near:   Left Eye Near:    Bilateral Near:     Physical Exam Vitals and nursing note reviewed.  Constitutional:      General: She is not in acute distress.    Appearance: She is well-developed and normal weight. She is not ill-appearing.  HENT:     Head: Normocephalic and atraumatic.  Eyes:     Conjunctiva/sclera: Conjunctivae normal.     Pupils: Pupils are equal, round, and reactive to light.  Cardiovascular:     Rate and Rhythm: Normal rate and regular rhythm.     Heart sounds: No murmur. No friction rub. Gallop present.   Pulmonary:     Effort: Pulmonary effort is normal. No tachypnea, accessory muscle usage or respiratory distress.     Breath sounds: No stridor. Examination of the right-upper field reveals wheezing. Examination of the left-upper field reveals wheezing. Examination of the right-middle field reveals wheezing. Examination of the left-middle field reveals wheezing. Examination of the right-lower field reveals wheezing. Examination of the left-lower field reveals wheezing. Wheezing present. No decreased breath sounds, rhonchi or rales.  Abdominal:     Palpations: Abdomen is soft.     Tenderness: There is no abdominal tenderness.  Musculoskeletal:     Cervical back: Normal range of motion and neck supple.     Right lower leg: No edema.     Left lower leg: No edema.  Skin:    General: Skin is warm and dry.  Neurological:     General: No focal deficit present.     Mental Status: She is alert and oriented to person, place, and time.  Psychiatric:        Mood and Affect: Mood normal.        Behavior: Behavior normal.      UC Treatments / Results  Labs (all labs ordered are listed, but only abnormal results are displayed) Labs Reviewed  NOVEL CORONAVIRUS, NAA (HOSP ORDER, SEND-OUT TO REF LAB; TAT 18-24 HRS)    EKG   Radiology No results found.  Procedures Procedures (including critical care time)  Medications Ordered  in UC Medications  albuterol (VENTOLIN HFA) 108 (90 Base) MCG/ACT inhaler 2 puff (has no administration in time range)    Initial Impression / Assessment and Plan / UC Course  I have reviewed the triage vital signs and the nursing notes.  Pertinent labs & imaging results that were available during my care of the patient were reviewed by me and considered in my medical decision making (  see chart for details).     #moderate persistent asthma w/ flare - Poorly controlled and never given daily controller medications. Wheezing today but not in distress. Saturating well. Giving Albuterol in clinic and scheduled plan for 24 hours. Prednisone 40mg  x 5 days and placing on low dose ICS for now. Informed she needs to establish care with a primary care provider and request sent. Discussed asthma plan with regard to albuterol PRN. ED precautions given and discussed.  - COVID PCR sent, though feel this is asthma related.   Final Clinical Impressions(s) / UC Diagnoses   Final diagnoses:  Moderate persistent asthma with exacerbation  Encounter for laboratory testing for COVID-19 virus     Discharge Instructions     Use the albuterol inhaler every 4 hours, 2 puffs for the next 1 day and then as needed for shortness of breath and wheezing.  Use the budesonide/pulmicort inhaler 2 times a day every day  Take the prednisone for 5 days.        ED Prescriptions    Medication Sig Dispense Auth. Provider   albuterol (VENTOLIN HFA) 108 (90 Base) MCG/ACT inhaler Inhale 2 puffs into the lungs every 4 (four) hours as needed for wheezing or shortness of breath (cough, shortness of breath or wheezing.). 18 g Maysun Meditz, , PA-C   Budesonide 90 MCG/ACT inhaler Inhale 1 puff into the lungs 2 (two) times daily. 1 each Rashaunda Rahl, Veryl Speak, PA-C   predniSONE (DELTASONE) 10 MG tablet Take 4 tablets (40 mg total) by mouth daily for 5 days. 20 tablet Laurina Fischl, Veryl Speak, PA-C     PDMP not reviewed this encounter.     Veryl Speak, PA-C 06/13/19 1211

## 2019-06-13 NOTE — Discharge Instructions (Addendum)
Use the albuterol inhaler every 4 hours, 2 puffs for the next 1 day and then as needed for shortness of breath and wheezing.  Use the budesonide/pulmicort inhaler 2 times a day every day  Take the prednisone for 5 days.

## 2019-06-13 NOTE — ED Triage Notes (Signed)
Onset of asthma "acting up" 2 days ago.  Patient complains of wheezing Denies coughing Denies runny nose Denies fever

## 2019-06-14 LAB — NOVEL CORONAVIRUS, NAA (HOSP ORDER, SEND-OUT TO REF LAB; TAT 18-24 HRS): SARS-CoV-2, NAA: NOT DETECTED

## 2019-07-09 ENCOUNTER — Ambulatory Visit (HOSPITAL_COMMUNITY)
Admission: EM | Admit: 2019-07-09 | Discharge: 2019-07-09 | Disposition: A | Discharge status: Home / Self Care | Payer: Medicaid Other | Attending: Emergency Medicine | Admitting: Emergency Medicine

## 2019-07-09 ENCOUNTER — Encounter (HOSPITAL_COMMUNITY): Payer: Self-pay

## 2019-07-09 ENCOUNTER — Other Ambulatory Visit: Payer: Self-pay

## 2019-07-09 DIAGNOSIS — M546 Pain in thoracic spine: Secondary | ICD-10-CM

## 2019-07-09 DIAGNOSIS — M545 Low back pain, unspecified: Secondary | ICD-10-CM

## 2019-07-09 DIAGNOSIS — Z3201 Encounter for pregnancy test, result positive: Secondary | ICD-10-CM

## 2019-07-09 LAB — POC URINE PREG, ED: Preg Test, Ur: POSITIVE — AB

## 2019-07-09 LAB — POCT URINALYSIS DIP (DEVICE)
Bilirubin Urine: NEGATIVE
Glucose, UA: NEGATIVE mg/dL
Hgb urine dipstick: NEGATIVE
Leukocytes,Ua: NEGATIVE
Nitrite: NEGATIVE
Protein, ur: NEGATIVE mg/dL
Specific Gravity, Urine: >=1.03 (ref 1.005–1.030)
Urobilinogen, UA: 1 mg/dL (ref 0.0–1.0)
pH: 6.5 (ref 5.0–8.0)

## 2019-07-09 LAB — POCT PREGNANCY, URINE: Preg Test, Ur: POSITIVE — AB

## 2019-07-09 MED ORDER — PRENATAL COMPLETE 14-0.4 MG PO TABS: 1.0000 | ORAL_TABLET | Freq: Every day | ORAL | 0 refills | Status: AC

## 2019-07-09 MED ORDER — ACETAMINOPHEN 500 MG PO TABS: 500.0000 mg | ORAL_TABLET | Freq: Four times a day (QID) | ORAL | 0 refills | Status: AC | PRN

## 2019-07-09 NOTE — ED Provider Notes (Signed)
[place]    CSN: [id number] Arrival date & time: 07/09/19  1609      History   Chief Complaint Chief Complaint  Patient presents with  . Back Pain  . Flank Pain    HPI Allison Roman is a 34 y.o. female.   Allison Roman presents with complaints of two weeks of intermittent upper and lower back pain. Comes and goes. Sometimes sharp. movement can trigger it at times. Denies any previous similar. No urinary frequency, sometimes pain with urination. No pelvic pain. No vaginal bleeding or vaginal symptoms. No known injury. Has had constipation. LMP 12/6, her periods can be late sometimes however. She is not on birth control. Has three children. Took ibuprofen yesterday which didn't help with her symptoms. Hasn't taken today. History of asthma and htn.     ROS per HPI, negative if not otherwise mentioned.      Past Medical History:  Diagnosis Date  . Asthma   . Hypertension     There are no problems to display for this patient.   History reviewed. No pertinent surgical history.  OB History   No obstetric history on file.      Home Medications    Prior to Admission medications   Medication Sig Start Date End Date Taking? Authorizing Provider  acetaminophen (TYLENOL) 500 MG tablet Take 1 tablet (500 mg total) by mouth every 6 (six) hours as needed. 07/09/19   Zigmund Gottron, NP  albuterol (VENTOLIN HFA) 108 (90 Base) MCG/ACT inhaler Inhale 2 puffs into the lungs every 4 (four) hours as needed for wheezing or shortness of breath (cough, shortness of breath or wheezing.). 06/13/19   Darr, Marguerita Beards, PA-C  Budesonide 90 MCG/ACT inhaler Inhale 1 puff into the lungs 2 (two) times daily. 06/13/19 07/13/19  Darr, Marguerita Beards, PA-C  Etonogestrel (NEXPLANON [place]) Inject into the skin.    [provider]  naproxen (NAPROSYN) 500 MG tablet Take 1 tablet (500 mg total) by mouth 2 (two) times daily. 08/26/17   Zigmund Gottron, NP  Prenatal Vit-Fe Fumarate-FA (PRENATAL  COMPLETE) 14-0.4 MG TABS Take 1 tablet by mouth daily. 07/09/19   Zigmund Gottron, NP    Family History Family History  Problem Relation Age of Onset  . Healthy Mother   . Healthy Father     Social History Social History   Tobacco Use  . Smoking status: Never Smoker  . Smokeless tobacco: Never Used  Substance Use Topics  . Alcohol use: No  . Drug use: No     Allergies   Patient has no known allergies.   Review of Systems Review of Systems   Physical Exam Triage Vital Signs ED Triage Vitals  Enc Vitals Group     BP 07/09/19 1634 (!) 141/81     Pulse Rate 07/09/19 1634 80     Resp 07/09/19 1634 18     Temp 07/09/19 1634 98.2 F (36.8 C)     Temp Source 07/09/19 1634 Oral     SpO2 07/09/19 1634 99 %     Weight --      Height --      Head Circumference --      Peak Flow --      Pain Score 07/09/19 1633 5     Pain Loc --      Pain Edu? --      Excl. in [place]? --    No data found.  Updated Vital Signs BP Marland Kitchen)  141/81 (BP Location: Right Arm)   Pulse 80   Temp 98.2 F (36.8 C) (Oral)   Resp 18   SpO2 99%    Physical Exam Constitutional:      General: She is not in acute distress.    Appearance: She is well-developed.  Cardiovascular:     Rate and Rhythm: Normal rate.  Pulmonary:     Effort: Pulmonary effort is normal.  Abdominal:     Tenderness: There is no abdominal tenderness.  Musculoskeletal:       Back:     Comments: Bilateral upper and low back musculature with tenderness on palpation; no cva tenderness; full ROm of back, upper and lower extremities; moving about room and on exam table without difficulty; strength equal bilaterally; gross sensation intact to extremities   Skin:    General: Skin is warm and dry.  Neurological:     Mental Status: She is alert and oriented to person, place, and time.      UC Treatments / Results  Labs (all labs ordered are listed, but only abnormal results are displayed) Labs Reviewed  POC URINE PREG, ED -  Abnormal; Notable for the following components:      Result Value   Preg Test, Ur POSITIVE (*)    All other components within normal limits  POCT URINALYSIS DIP (DEVICE) - Abnormal; Notable for the following components:   Ketones, ur TRACE (*)    All other components within normal limits  POCT PREGNANCY, URINE - Abnormal; Notable for the following components:   Preg Test, Ur POSITIVE (*)    All other components within normal limits    EKG   Radiology No results found.  Procedures Procedures (including critical care time)  Medications Ordered in UC Medications - No data to display  Initial Impression / Assessment and Plan / UC Course  I have reviewed the triage vital signs and the nursing notes.  Pertinent labs & imaging results that were available during my care of the patient were reviewed by me and considered in my medical decision making (see chart for details).     Muscular back pain on exam. Elevated specific gravity as well as trace ketones to urine, encouraged increased fluids. Positive pregnancy test, approximately 7 w 5d pregnant per LMP. Patient shares that this was an unwanted pregnancy and discussed follow up options for her. Tylenol as needed for pain prn at this time. Return precautions provided. Patient verbalized understanding and agreeable to plan.  Ambulatory out of clinic without difficulty.    Final Clinical Impressions(s) / UC Diagnoses   Final diagnoses:  Acute bilateral low back pain without sciatica  Positive pregnancy test     Discharge Instructions     Heat, massage, light stretching to the low back. Tylenol as needed for pain. No further ibuprofen.  Daily prenatal vitamin is recommended.  Your estimated due date is 02/20/2020, you are approximately 7 weeks 5 days pregnant based on your last period.     ED Prescriptions    Medication Sig Dispense Auth. Provider   acetaminophen (TYLENOL) 500 MG tablet Take 1 tablet (500 mg total) by mouth  every 6 (six) hours as needed. 30 tablet Linus Mako B, NP   Prenatal Vit-Fe Fumarate-FA (PRENATAL COMPLETE) 14-0.4 MG TABS Take 1 tablet by mouth daily. 60 tablet Georgetta Haber, NP     PDMP not reviewed this encounter.   Georgetta Haber, NP 07/09/19 [phone]

## 2019-07-09 NOTE — Discharge Instructions (Signed)
Heat, massage, light stretching to the low back. Tylenol as needed for pain. No further ibuprofen.  Daily prenatal vitamin is recommended.  Your estimated due date is 02/20/2020, you are approximately 7 weeks 5 days pregnant based on your last period.

## 2019-07-09 NOTE — ED Triage Notes (Signed)
Patient presents to Urgent Care with complaints of back pain and right flank pain as well as gas pains in her abdomen since the past week. Patient reports she took ibuprofen for her pain yesterday but it did not help.

## 2020-04-20 ENCOUNTER — Other Ambulatory Visit: Payer: Self-pay

## 2020-04-20 ENCOUNTER — Ambulatory Visit (HOSPITAL_COMMUNITY)
Admission: EM | Admit: 2020-04-20 | Discharge: 2020-04-20 | Disposition: A | Discharge status: Home / Self Care | Payer: Self-pay | Attending: Family Medicine | Admitting: Family Medicine

## 2020-04-20 ENCOUNTER — Encounter (HOSPITAL_COMMUNITY): Payer: Self-pay | Admitting: Emergency Medicine

## 2020-04-20 DIAGNOSIS — J4541 Moderate persistent asthma with (acute) exacerbation: Secondary | ICD-10-CM

## 2020-04-20 MED ORDER — PROMETHAZINE-DM 6.25-15 MG/5ML PO SYRP: 5.0000 mL | ORAL_SOLUTION | Freq: Four times a day (QID) | ORAL | 0 refills | Status: AC | PRN

## 2020-04-20 MED ORDER — PREDNISONE 10 MG PO TABS: ORAL_TABLET | ORAL | 0 refills | Status: DC

## 2020-04-20 MED ORDER — ALBUTEROL SULFATE HFA 108 (90 BASE) MCG/ACT IN AERS: 1.0000 | INHALATION_SPRAY | Freq: Four times a day (QID) | RESPIRATORY_TRACT | 0 refills | Status: DC | PRN

## 2020-04-20 MED ORDER — BUDESONIDE-FORMOTEROL FUMARATE 160-4.5 MCG/ACT IN AERO: 2.0000 | INHALATION_SPRAY | Freq: Two times a day (BID) | RESPIRATORY_TRACT | 3 refills | Status: DC

## 2020-04-20 NOTE — ED Triage Notes (Signed)
Pt c/o chest tightness and cough x 5 days. She states she has to use her inhaler almost every 5 minutes. She feels like she is wheezing a lot.

## 2020-04-20 NOTE — ED Provider Notes (Signed)
[facility]    CSN: [id number] Arrival date & time: 04/20/20  5643      History   Chief Complaint Chief Complaint  Patient presents with  . Asthma    HPI Declyn Seabrooks is a 34 y.o. female.   Presenting today with 4-5 days of productive cough, chest tightness, wheezing, SOB, headache, low grade subjective fever. She denies sore throat, nasal congestion, rashes, CP, N/V/D. Using her home inhaler regimen with mild relief. Hx of asthma, allergies. No known sick contacts. Declines COVID testing.      Past Medical History:  Diagnosis Date  . Asthma   . Hypertension     There are no problems to display for this patient.   History reviewed. No pertinent surgical history.  OB History   No obstetric history on file.      Home Medications    Prior to Admission medications   Medication Sig Start Date End Date Taking? Authorizing Provider  acetaminophen (TYLENOL) 500 MG tablet Take 1 tablet (500 mg total) by mouth every 6 (six) hours as needed. 07/09/19   Georgetta Haber, NP  albuterol (VENTOLIN HFA) 108 (90 Base) MCG/ACT inhaler Inhale 2 puffs into the lungs every 4 (four) hours as needed for wheezing or shortness of breath (cough, shortness of breath or wheezing.). 06/13/19   Darr, Gerilyn Pilgrim, PA-C  albuterol (VENTOLIN HFA) 108 (90 Base) MCG/ACT inhaler Inhale 1-2 puffs into the lungs every 6 (six) hours as needed for wheezing or shortness of breath. 04/20/20   Particia Nearing, PA-C  Budesonide 90 MCG/ACT inhaler Inhale 1 puff into the lungs 2 (two) times daily. 06/13/19 07/13/19  Darr, Gerilyn Pilgrim, PA-C  budesonide-formoterol (SYMBICORT) 160-4.5 MCG/ACT inhaler Inhale 2 puffs into the lungs 2 (two) times daily. 04/20/20   Particia Nearing, PA-C  Etonogestrel [facility]) Inject into the skin.    [provider]  naproxen (NAPROSYN) 500 MG tablet Take 1 tablet (500 mg total) by mouth 2 (two) times daily. 08/26/17   Georgetta Haber, NP  predniSONE  (DELTASONE) 10 MG tablet Take 6 tabs day one, 5 tabs day two, 4 tabs day three, etc 04/20/20   Particia Nearing, PA-C  Prenatal Vit-Fe Fumarate-FA (PRENATAL COMPLETE) 14-0.4 MG TABS Take 1 tablet by mouth daily. 07/09/19   Georgetta Haber, NP  promethazine-dextromethorphan (PROMETHAZINE-DM) 6.25-15 MG/5ML syrup Take 5 mLs by mouth 4 (four) times daily as needed for cough. 04/20/20   Particia Nearing, PA-C    Family History Family History  Problem Relation Age of Onset  . Healthy Mother   . Healthy Father     Social History Social History   Tobacco Use  . Smoking status: Never Smoker  . Smokeless tobacco: Never Used  Vaping Use  . Vaping Use: Never used  Substance Use Topics  . Alcohol use: No  . Drug use: No     Allergies   Patient has no known allergies.   Review of Systems Review of Systems PER HPI    Physical Exam Triage Vital Signs ED Triage Vitals  Enc Vitals Group     BP 04/20/20 0825 (!) 145/98     Pulse Rate 04/20/20 0825 91     Resp 04/20/20 0825 19     Temp 04/20/20 0825 98.1 F (36.7 C)     Temp Source 04/20/20 0825 Oral     SpO2 04/20/20 0825 97 %     Weight --      Height --  Head Circumference --      Peak Flow --      Pain Score 04/20/20 0823 0     Pain Loc --      Pain Edu? --      Excl. in [place]? --    No data found.  Updated Vital Signs BP (!) 145/98 (BP Location: Left Arm)   Pulse 91   Temp 98.1 F (36.7 C) (Oral)   Resp 19   LMP 04/04/2020   SpO2 97%   Visual Acuity Right Eye Distance:   Left Eye Distance:   Bilateral Distance:    Right Eye Near:   Left Eye Near:    Bilateral Near:     Physical Exam Vitals and nursing note reviewed.  Constitutional:      Appearance: Normal appearance. She is not ill-appearing.  HENT:     Head: Atraumatic.     Right Ear: Tympanic membrane normal.     Left Ear: Tympanic membrane normal.     Nose: Rhinorrhea present.     Mouth/Throat:     Mouth: Mucous membranes are  moist.     Pharynx: Posterior oropharyngeal erythema present.  Eyes:     Extraocular Movements: Extraocular movements intact.     Conjunctiva/sclera: Conjunctivae normal.  Cardiovascular:     Rate and Rhythm: Normal rate and regular rhythm.     Heart sounds: Normal heart sounds.  Pulmonary:     Effort: Pulmonary effort is normal.     Breath sounds: Wheezing (diffuse) present. No rales.  Musculoskeletal:        General: Normal range of motion.     Cervical back: Normal range of motion and neck supple.  Skin:    General: Skin is warm and dry.  Neurological:     Mental Status: She is alert and oriented to person, place, and time.  Psychiatric:        Mood and Affect: Mood normal.        Thought Content: Thought content normal.        Judgment: Judgment normal.     UC Treatments / Results  Labs (all labs ordered are listed, but only abnormal results are displayed) Labs Reviewed - No data to display  EKG   Radiology No results found.  Procedures Procedures (including critical care time)  Medications Ordered in UC Medications - No data to display  Initial Impression / Assessment and Plan / UC Course  I have reviewed the triage vital signs and the nursing notes.  Pertinent labs & imaging results that were available during my care of the patient were reviewed by me and considered in my medical decision making (see chart for details).     Suspect asthma exacerbation from viral URI. She declines respiratory testing today. Will increase inhaler regimen to symbicort and albuterol, start prednisone taper, phenergan DM for cough, and recommended mucinex OTC. Discussed supportive care and return precautions, isolation protocol since not testing.   Final Clinical Impressions(s) / UC Diagnoses   Final diagnoses:  Moderate persistent asthma with exacerbation   Discharge Instructions   None    ED Prescriptions    Medication Sig Dispense Auth. Provider    budesonide-formoterol (SYMBICORT) 160-4.5 MCG/ACT inhaler Inhale 2 puffs into the lungs 2 (two) times daily. 1 each Particia Nearing, PA-C   albuterol (VENTOLIN HFA) 108 (90 Base) MCG/ACT inhaler Inhale 1-2 puffs into the lungs every 6 (six) hours as needed for wheezing or shortness of breath. 18 g Roosvelt Maser  Elizabeth, PA-C   predniSONE (DELTASONE) 10 MG tablet Take 6 tabs day one, 5 tabs day two, 4 tabs day three, etc 21 tablet Particia Nearing, PA-C   promethazine-dextromethorphan (PROMETHAZINE-DM) 6.25-15 MG/5ML syrup Take 5 mLs by mouth 4 (four) times daily as needed for cough. 100 mL Particia Nearing, [place]     PDMP not reviewed this encounter.   Particia Nearing, [place] 04/20/20 1116

## 2020-05-23 ENCOUNTER — Encounter (HOSPITAL_COMMUNITY): Payer: Self-pay

## 2020-05-23 ENCOUNTER — Ambulatory Visit (HOSPITAL_COMMUNITY)
Admission: EM | Admit: 2020-05-23 | Discharge: 2020-05-23 | Disposition: A | Discharge status: Home / Self Care | Payer: HRSA Program | Attending: Student | Admitting: Student

## 2020-05-23 ENCOUNTER — Other Ambulatory Visit: Payer: Self-pay

## 2020-05-23 ENCOUNTER — Telehealth (HOSPITAL_COMMUNITY): Payer: Self-pay | Admitting: *Deleted

## 2020-05-23 DIAGNOSIS — J111 Influenza due to unidentified influenza virus with other respiratory manifestations: Secondary | ICD-10-CM

## 2020-05-23 DIAGNOSIS — Z7952 Long term (current) use of systemic steroids: Secondary | ICD-10-CM | POA: Insufficient documentation

## 2020-05-23 DIAGNOSIS — J45901 Unspecified asthma with (acute) exacerbation: Secondary | ICD-10-CM | POA: Diagnosis present

## 2020-05-23 DIAGNOSIS — Z7951 Long term (current) use of inhaled steroids: Secondary | ICD-10-CM | POA: Insufficient documentation

## 2020-05-23 DIAGNOSIS — Z79899 Other long term (current) drug therapy: Secondary | ICD-10-CM | POA: Diagnosis not present

## 2020-05-23 DIAGNOSIS — U071 COVID-19: Secondary | ICD-10-CM | POA: Diagnosis not present

## 2020-05-23 LAB — RESP PANEL BY RT-PCR (FLU A&B, COVID) ARPGX2
Influenza A by PCR: NEGATIVE
Influenza B by PCR: NEGATIVE
SARS Coronavirus 2 by RT PCR: POSITIVE — AB

## 2020-05-23 MED ORDER — PREDNISONE 10 MG PO TABS: 20.0000 mg | ORAL_TABLET | Freq: Every day | ORAL | 0 refills | Status: DC

## 2020-05-23 MED ORDER — BUDESONIDE-FORMOTEROL FUMARATE 160-4.5 MCG/ACT IN AERO: 2.0000 | INHALATION_SPRAY | Freq: Every morning | RESPIRATORY_TRACT | 12 refills | Status: AC

## 2020-05-23 MED ORDER — BUDESONIDE-FORMOTEROL FUMARATE 160-4.5 MCG/ACT IN AERO: 2.0000 | INHALATION_SPRAY | Freq: Every morning | RESPIRATORY_TRACT | 12 refills | Status: DC

## 2020-05-23 MED ORDER — ALBUTEROL SULFATE HFA 108 (90 BASE) MCG/ACT IN AERS: 2.0000 | INHALATION_SPRAY | RESPIRATORY_TRACT | 0 refills | Status: DC | PRN

## 2020-05-23 MED ORDER — PREDNISONE 10 MG PO TABS: 20.0000 mg | ORAL_TABLET | Freq: Every day | ORAL | 0 refills | Status: AC

## 2020-05-23 MED ORDER — ALBUTEROL SULFATE HFA 108 (90 BASE) MCG/ACT IN AERS: 2.0000 | INHALATION_SPRAY | RESPIRATORY_TRACT | 0 refills | Status: AC | PRN

## 2020-05-23 NOTE — ED Provider Notes (Signed)
[facility]    CSN: [id number] Arrival date & time: 05/23/20  1454      History   Chief Complaint Chief Complaint  Patient presents with  . Chest Pain  . Sore Throat  . Headache    HPI Allison Roman is a 34 y.o. female presenting for 2 days of sore throat, shortness of breath, and headache. She was recently exposed to her sister, who is diagnosed with covid-19. Patient is not vaccinated for covid-19. She has a long history of asthma, and is out of her symbicort inhaler, though albuterol is still providing some relief. She typically requires prednisone taper when she has an asthma exacerbation. Endorses chest wall pain when breathing in or pressing on chest or occasionally coughing. denies chest pain at rest, denies pain down either arm. Denies fevers/chills, n/v/d, cough, facial pain, teeth pain,  loss of taste/smell, swollen lymph nodes, ear pain.    HPI  Past Medical History:  Diagnosis Date  . Asthma   . Hypertension     There are no problems to display for this patient.   History reviewed. No pertinent surgical history.  OB History   No obstetric history on file.      Home Medications    Prior to Admission medications   Medication Sig Start Date End Date Taking? Authorizing Provider  acetaminophen (TYLENOL) 500 MG tablet Take 1 tablet (500 mg total) by mouth every 6 (six) hours as needed. 07/09/19   Georgetta Haber, NP  albuterol (VENTOLIN HFA) 108 (90 Base) MCG/ACT inhaler Inhale 2 puffs into the lungs every 4 (four) hours as needed for wheezing or shortness of breath. 05/23/20 06/22/20  Rhys Martini, PA-C  Budesonide 90 MCG/ACT inhaler Inhale 1 puff into the lungs 2 (two) times daily. 06/13/19 07/13/19  Darr, Gerilyn Pilgrim, PA-C  budesonide-formoterol (SYMBICORT) 160-4.5 MCG/ACT inhaler Inhale 2 puffs into the lungs every morning. 05/23/20   Rhys Martini, PA-C  Etonogestrel (NEXPLANON [place]) Inject into the skin.    [provider]  naproxen  (NAPROSYN) 500 MG tablet Take 1 tablet (500 mg total) by mouth 2 (two) times daily. 08/26/17   Georgetta Haber, NP  predniSONE (DELTASONE) 10 MG tablet Take 2 tablets (20 mg total) by mouth daily. 05/23/20   Rhys Martini, PA-C  Prenatal Vit-Fe Fumarate-FA (PRENATAL COMPLETE) 14-0.4 MG TABS Take 1 tablet by mouth daily. 07/09/19   Georgetta Haber, NP  promethazine-dextromethorphan (PROMETHAZINE-DM) 6.25-15 MG/5ML syrup Take 5 mLs by mouth 4 (four) times daily as needed for cough. 04/20/20   Particia Nearing, PA-C    Family History Family History  Problem Relation Age of Onset  . Healthy Mother   . Healthy Father     Social History Social History   Tobacco Use  . Smoking status: Never Smoker  . Smokeless tobacco: Never Used  Vaping Use  . Vaping Use: Never used  Substance Use Topics  . Alcohol use: No  . Drug use: No     Allergies   Patient has no known allergies.   Review of Systems Review of Systems  Constitutional: Negative for chills and fever.  HENT: Positive for sore throat. Negative for ear pain and sinus pain.   Respiratory: Positive for shortness of breath and wheezing. Negative for cough and chest tightness.   Cardiovascular: Negative for chest pain.  Gastrointestinal: Negative for abdominal pain, diarrhea, nausea and vomiting.  Neurological: Positive for headaches. Negative for dizziness.     Physical Exam Triage  Vital Signs ED Triage Vitals  Enc Vitals Group     BP 05/23/20 1537 118/71     Pulse Rate 05/23/20 1537 (!) 106     Resp 05/23/20 1537 (!) 21     Temp 05/23/20 1537 99.4 F (37.4 C)     Temp Source 05/23/20 1537 Oral     SpO2 05/23/20 1537 100 %     Weight 05/23/20 1541 226 lb (102.5 kg)     Height 05/23/20 1540 5\' 8"  (1.727 m)     Head Circumference --      Peak Flow --      Pain Score 05/23/20 1539 8     Pain Loc --      Pain Edu? --      Excl. in GC? --    No data found.  Updated Vital Signs BP 118/71 (BP Location: Right  Arm)   Pulse (!) 106   Temp 99.4 F (37.4 C) (Oral)   Resp (!) 21   Ht 5\' 8"  (1.727 m)   Wt 226 lb (102.5 kg)   LMP 05/05/2020   SpO2 100%   BMI 34.36 kg/m   Visual Acuity Right Eye Distance:   Left Eye Distance:   Bilateral Distance:    Right Eye Near:   Left Eye Near:    Bilateral Near:     Physical Exam Vitals reviewed.  Constitutional:      General: She is not in acute distress.    Appearance: Normal appearance. She is well-developed. She is ill-appearing.  HENT:     Head: Normocephalic and atraumatic.     Right Ear: Hearing, tympanic membrane, ear canal and external ear normal. No drainage. No middle ear effusion. Tympanic membrane is not perforated, erythematous, retracted or bulging.     Left Ear: Hearing, tympanic membrane, ear canal and external ear normal. No drainage.  No middle ear effusion. Tympanic membrane is not perforated, erythematous, retracted or bulging.     Nose: Nose normal.     Right Sinus: No maxillary sinus tenderness or frontal sinus tenderness.     Left Sinus: No maxillary sinus tenderness or frontal sinus tenderness.     Mouth/Throat:     Mouth: Mucous membranes are moist.     Pharynx: Uvula midline. Posterior oropharyngeal erythema present. No oropharyngeal exudate or uvula swelling.     Tonsils: No tonsillar exudate.  Cardiovascular:     Rate and Rhythm: Normal rate and regular rhythm.     Pulses:          Radial pulses are 2+ on the right side and 2+ on the left side.     Heart sounds: Normal heart sounds.  Pulmonary:     Effort: Pulmonary effort is normal.     Breath sounds: Normal air entry. Wheezing present.     Comments: Scattered wheezes throughoug Abdominal:     General: Bowel sounds are normal.     Palpations: Abdomen is soft.     Tenderness: There is no abdominal tenderness. There is no guarding or rebound. Negative signs include Murphy's sign and McBurney's sign.  Musculoskeletal:     Comments: Reproducible chest wall pain  along right sternal border   Lymphadenopathy:     Cervical: No cervical adenopathy.  Neurological:     General: No focal deficit present.     Mental Status: She is alert.  Psychiatric:        Attention and Perception: Attention and perception normal.  Mood and Affect: Mood and affect normal.        Behavior: Behavior is cooperative.      UC Treatments / Results  Labs (all labs ordered are listed, but only abnormal results are displayed) Labs Reviewed  RESP PANEL BY RT-PCR (FLU A&B, COVID) [id number]    EKG   Radiology No results found.  Procedures Procedures (including critical care time)  Medications Ordered in UC Medications - No data to display  Initial Impression / Assessment and Plan / UC Course  I have reviewed the triage vital signs and the nursing notes.  Pertinent labs & imaging results that were available during my care of the patient were reviewed by me and considered in my medical decision making (see chart for details).     Suspect asthma exacerbation from viral URI. Pt is oxygenating well on room air. symbicort and albuterol inhalers refilled today. Prednisone taper sent. She does not have diabetes. Also rec symptomatic care with mucinex/nyquil/etc.  covid and influenza tests collected.   Final Clinical Impressions(s) / UC Diagnoses   Final diagnoses:  Influenza-like illness  Exacerbation of persistent asthma, unspecified asthma severity     Discharge Instructions     Albuterol, symbicort, and prednisone taper. Return precautions- worsening of shortness of breath despite treatment, fevers/chills, etc.     ED Prescriptions    Medication Sig Dispense Auth. Provider   albuterol (VENTOLIN HFA) 108 (90 Base) MCG/ACT inhaler Inhale 2 puffs into the lungs every 4 (four) hours as needed for wheezing or shortness of breath. 18 g Rhys Martini, PA-C   budesonide-formoterol [facility]) 160-4.5 MCG/ACT inhaler Inhale 2 puffs into the lungs every  morning. 1 each Rhys Martini, PA-C   predniSONE (DELTASONE) 10 MG tablet Take 2 tablets (20 mg total) by mouth daily. 15 tablet Rhys Martini, PA-C     PDMP not reviewed this encounter.   Rhys Martini, PA-C 05/23/20 1631

## 2020-05-23 NOTE — Discharge Instructions (Signed)
Albuterol, symbicort, and prednisone taper. Return precautions- worsening of shortness of breath despite treatment, fevers/chills, etc.

## 2020-05-23 NOTE — ED Triage Notes (Signed)
Pt presents to UC today with c/o chest pain, headache and sore throat for 2 days. Pain score 8/0-10 scale. Pt has taken ibuprofen with no improvement. Pt is not vaccinated. Pt has been in contact with confirmed covid + family member.

## 2020-05-24 ENCOUNTER — Telehealth (HOSPITAL_COMMUNITY): Payer: Self-pay

## 2020-05-24 ENCOUNTER — Telehealth: Payer: Self-pay

## 2020-05-24 NOTE — Telephone Encounter (Signed)
Called to Discuss with patient about Covid symptoms and the use of the monoclonal antibody infusion for those with mild to moderate Covid symptoms and at a high risk of hospitalization.     Pt appears to qualify for this infusion due to co-morbid conditions and/or a member of an at-risk group in accordance with the FDA Emergency Use Authorization.    Unable to reach pt    

## 2020-05-24 NOTE — Telephone Encounter (Signed)
Attempted to call patient with interpreter on the line to notify of positive covid test results. No answer from patient at this time and the voicemail has not been set up yet. Will retry.

## 2020-06-14 ENCOUNTER — Emergency Department (HOSPITAL_COMMUNITY)
Admission: EM | Admit: 2020-06-14 | Discharge: 2020-06-15 | Disposition: A | Discharge status: Home / Self Care | Payer: Medicaid Other | Attending: Emergency Medicine | Admitting: Emergency Medicine

## 2020-06-14 ENCOUNTER — Other Ambulatory Visit: Payer: Self-pay

## 2020-06-14 DIAGNOSIS — I951 Orthostatic hypotension: Secondary | ICD-10-CM | POA: Insufficient documentation

## 2020-06-14 DIAGNOSIS — J45909 Unspecified asthma, uncomplicated: Secondary | ICD-10-CM | POA: Insufficient documentation

## 2020-06-14 DIAGNOSIS — Z8616 Personal history of COVID-19: Secondary | ICD-10-CM | POA: Insufficient documentation

## 2020-06-14 DIAGNOSIS — I10 Essential (primary) hypertension: Secondary | ICD-10-CM | POA: Insufficient documentation

## 2020-06-14 DIAGNOSIS — K5900 Constipation, unspecified: Secondary | ICD-10-CM | POA: Insufficient documentation

## 2020-06-14 DIAGNOSIS — R42 Dizziness and giddiness: Secondary | ICD-10-CM

## 2020-06-14 DIAGNOSIS — M791 Myalgia, unspecified site: Secondary | ICD-10-CM | POA: Insufficient documentation

## 2020-06-14 LAB — CBC
HCT: 45.9 % (ref 36.0–46.0)
Hemoglobin: 15 g/dL (ref 12.0–15.0)
MCH: 27.7 pg (ref 26.0–34.0)
MCHC: 32.7 g/dL (ref 30.0–36.0)
MCV: 84.8 fL (ref 80.0–100.0)
Platelets: 251 10*3/uL (ref 150–400)
RBC: 5.41 MIL/uL — ABNORMAL HIGH (ref 3.87–5.11)
RDW: 13.2 % (ref 11.5–15.5)
WBC: 9.1 10*3/uL (ref 4.0–10.5)
nRBC: 0 % (ref 0.0–0.2)

## 2020-06-14 LAB — BASIC METABOLIC PANEL
Anion gap: 11 (ref 5–15)
BUN: 12 mg/dL (ref 6–20)
CO2: 22 mmol/L (ref 22–32)
Calcium: 9.4 mg/dL (ref 8.9–10.3)
Chloride: 106 mmol/L (ref 98–111)
Creatinine, Ser: 0.78 mg/dL (ref 0.44–1.00)
GFR, Estimated: >60 mL/min (ref >60)
Glucose, Bld: 88 mg/dL (ref 70–99)
Potassium: 3.7 mmol/L (ref 3.5–5.1)
Sodium: 139 mmol/L (ref 135–145)

## 2020-06-14 LAB — URINALYSIS, ROUTINE W REFLEX MICROSCOPIC
Bilirubin Urine: NEGATIVE
Glucose, UA: NEGATIVE mg/dL
Hgb urine dipstick: NEGATIVE
Ketones, ur: NEGATIVE mg/dL
Leukocytes,Ua: NEGATIVE
Nitrite: NEGATIVE
Protein, ur: NEGATIVE mg/dL
Specific Gravity, Urine: 1.02 (ref 1.005–1.030)
pH: 6.5 (ref 5.0–8.0)

## 2020-06-14 LAB — I-STAT BETA HCG BLOOD, ED (MC, WL, AP ONLY): I-stat hCG, quantitative: <5 m[IU]/mL (ref <5)

## 2020-06-14 NOTE — ED Triage Notes (Signed)
Patient stated tested + for covid on 05/23/20; and still having generalized body aches and dizziness. Patient c/o difficulty having bowel movement as well. Stated last BM 3 days ago,

## 2020-06-14 NOTE — ED Provider Notes (Incomplete)
[facility] EMERGENCY DEPARTMENT Provider Note   CSN: [id number] Arrival date & time: 06/14/20  1137     History Chief Complaint  Patient presents with  . Dizziness  . Generalized Body Aches  . Constipation    Allison Roman is a 35 y.o. female.  HPI     Past Medical History:  Diagnosis Date  . Asthma   . Hypertension     There are no problems to display for this patient.   No past surgical history on file.   OB History   No obstetric history on file.     Family History  Problem Relation Age of Onset  . Healthy Mother   . Healthy Father     Social History   Tobacco Use  . Smoking status: Never Smoker  . Smokeless tobacco: Never Used  Vaping Use  . Vaping Use: Never used  Substance Use Topics  . Alcohol use: No  . Drug use: No    Home Medications Prior to Admission medications   Medication Sig Start Date End Date Taking? Authorizing Provider  acetaminophen (TYLENOL) 500 MG tablet Take 1 tablet (500 mg total) by mouth every 6 (six) hours as needed. 07/09/19   Georgetta Haber, NP  albuterol (VENTOLIN HFA) 108 (90 Base) MCG/ACT inhaler Inhale 2 puffs into the lungs every 4 (four) hours as needed for wheezing or shortness of breath. 05/23/20 06/22/20  Rhys Martini, PA-C  Budesonide 90 MCG/ACT inhaler Inhale 1 puff into the lungs 2 (two) times daily. 06/13/19 07/13/19  Darr, Gerilyn Pilgrim, PA-C  budesonide-formoterol (SYMBICORT) 160-4.5 MCG/ACT inhaler Inhale 2 puffs into the lungs every morning. 05/23/20   Rhys Martini, PA-C  Etonogestrel (NEXPLANON [place]) Inject into the skin.    [provider]  naproxen (NAPROSYN) 500 MG tablet Take 1 tablet (500 mg total) by mouth 2 (two) times daily. 08/26/17   Georgetta Haber, NP  predniSONE (DELTASONE) 10 MG tablet Take 2 tablets (20 mg total) by mouth daily. 05/23/20   Rhys Martini, PA-C  Prenatal Vit-Fe Fumarate-FA (PRENATAL COMPLETE) 14-0.4 MG TABS Take 1 tablet by mouth daily. 07/09/19   Georgetta Haber, NP  promethazine-dextromethorphan (PROMETHAZINE-DM) 6.25-15 MG/5ML syrup Take 5 mLs by mouth 4 (four) times daily as needed for cough. 04/20/20   Particia Nearing, PA-C    Allergies    Patient has no known allergies.  Review of Systems   Review of Systems  Ten systems reviewed and are negative for acute change, except as noted in the HPI.    Physical Exam Updated Vital Signs BP 103/85   Pulse 97   Temp 98.5 F (36.9 C) (Oral)   Resp 15   Ht 5\' 8"  (1.727 m)   Wt 77.1 kg   SpO2 96%   BMI 25.85 kg/m   Physical Exam Vitals and nursing note reviewed.  Constitutional:      General: She is not in acute distress.    Appearance: She is well-developed and well-nourished. She is not diaphoretic.     Comments: Nontoxic appearing and in NAD  HENT:     Head: Normocephalic and atraumatic.  Eyes:     General: No scleral icterus.    Extraocular Movements: EOM normal.     Conjunctiva/sclera: Conjunctivae normal.  Cardiovascular:     Rate and Rhythm: Normal rate and regular rhythm.     Pulses: Normal pulses.  Pulmonary:     Effort: Pulmonary effort is normal. No respiratory distress.  Breath sounds: No stridor. No wheezing.     Comments: Respirations even and unlabored. Lungs CTAB. Musculoskeletal:        General: Normal range of motion.     Cervical back: Normal range of motion.  Skin:    General: Skin is warm and dry.     Coloration: Skin is not pale.     Findings: No erythema or rash.  Neurological:     Mental Status: She is alert and oriented to person, place, and time.     Coordination: Coordination normal.  Psychiatric:        Mood and Affect: Mood and affect normal.        Behavior: Behavior normal.     ED Results / Procedures / Treatments   Labs (all labs ordered are listed, but only abnormal results are displayed) Labs Reviewed  CBC - Abnormal; Notable for the following components:      Result Value   RBC 5.41 (*)    All other components  within normal limits  BASIC METABOLIC PANEL  URINALYSIS, ROUTINE W REFLEX MICROSCOPIC  I-STAT BETA HCG BLOOD, ED ([facility], [facility], [facility] ONLY)  CBG MONITORING, ED    EKG None  Radiology No results found.  Procedures Procedures (including critical care time)  Medications Ordered in ED Medications - No data to display  ED Course  I have reviewed the triage vital signs and the nursing notes.  Pertinent labs & imaging results that were available during my care of the patient were reviewed by me and considered in my medical decision making (see chart for details).    MDM Rules/Calculators/A&P                          *** Final Clinical Impression(s) / ED Diagnoses Final diagnoses:  None    Rx / DC Orders ED Discharge Orders    None

## 2020-06-14 NOTE — ED Provider Notes (Signed)
[facility] EMERGENCY DEPARTMENT Provider Note   CSN: [id number] Arrival date & time: 06/14/20  1137     History Chief Complaint  Patient presents with  . Dizziness  . Generalized Body Aches  . Constipation    Allison Roman is a 35 y.o. female.   35 year old female with history of asthma and hypertension presents to the emergency department for persistent body aches and lightheadedness.  She tested positive for COVID-19 on 05/23/2020.  Reports that her lightheadedness is aggravated by position change.  Triage note references difficulty having bowel movements; patient confirms mild degree of constipation.  She has had decreased PO intake since her COVID diagnosis.  The history is provided by the patient. No language interpreter was used.  Dizziness Quality:  Lightheadedness Severity:  Mild Timing:  Intermittent Progression:  Waxing and waning Chronicity:  New Context: standing up   Context: not with loss of consciousness   Ineffective treatments:  None tried Associated symptoms: weakness (generalized)   Associated symptoms: no blood in stool, no diarrhea, no syncope and no vomiting        Past Medical History:  Diagnosis Date  . Asthma   . Hypertension     There are no problems to display for this patient.   No past surgical history on file.   OB History   No obstetric history on file.     Family History  Problem Relation Age of Onset  . Healthy Mother   . Healthy Father     Social History   Tobacco Use  . Smoking status: Never Smoker  . Smokeless tobacco: Never Used  Vaping Use  . Vaping Use: Never used  Substance Use Topics  . Alcohol use: No  . Drug use: No    Home Medications Prior to Admission medications   Medication Sig Start Date End Date Taking? Authorizing Provider  acetaminophen (TYLENOL) 500 MG tablet Take 1 tablet (500 mg total) by mouth every 6 (six) hours as needed. 07/09/19   Georgetta Haber, NP  albuterol  (VENTOLIN HFA) 108 (90 Base) MCG/ACT inhaler Inhale 2 puffs into the lungs every 4 (four) hours as needed for wheezing or shortness of breath. 05/23/20 06/22/20  Rhys Martini, PA-C  Budesonide 90 MCG/ACT inhaler Inhale 1 puff into the lungs 2 (two) times daily. 06/13/19 07/13/19  Darr, Gerilyn Pilgrim, PA-C  budesonide-formoterol (SYMBICORT) 160-4.5 MCG/ACT inhaler Inhale 2 puffs into the lungs every morning. 05/23/20   Rhys Martini, PA-C  Etonogestrel (NEXPLANON [place]) Inject into the skin.    [provider]  naproxen (NAPROSYN) 500 MG tablet Take 1 tablet (500 mg total) by mouth 2 (two) times daily. 08/26/17   Georgetta Haber, NP  predniSONE (DELTASONE) 10 MG tablet Take 2 tablets (20 mg total) by mouth daily. 05/23/20   Rhys Martini, PA-C  Prenatal Vit-Fe Fumarate-FA (PRENATAL COMPLETE) 14-0.4 MG TABS Take 1 tablet by mouth daily. 07/09/19   Georgetta Haber, NP  promethazine-dextromethorphan (PROMETHAZINE-DM) 6.25-15 MG/5ML syrup Take 5 mLs by mouth 4 (four) times daily as needed for cough. 04/20/20   Particia Nearing, PA-C    Allergies    Patient has no known allergies.  Review of Systems   Review of Systems  Cardiovascular: Negative for syncope.  Gastrointestinal: Negative for blood in stool, diarrhea and vomiting.  Neurological: Positive for dizziness and weakness (generalized).  Ten systems reviewed and are negative for acute change, except as noted in the HPI.    Physical Exam  Updated Vital Signs BP 103/65   Pulse 97   Temp 98.5 F (36.9 C) (Oral)   Resp 13   Ht 5\' 8"  (1.727 m)   Wt 77.1 kg   SpO2 96%   BMI 25.85 kg/m   Physical Exam Vitals and nursing note reviewed.  Constitutional:      General: She is not in acute distress.    Appearance: She is well-developed and well-nourished. She is not diaphoretic.     Comments: Nontoxic appearing and in NAD  HENT:     Head: Normocephalic and atraumatic.  Eyes:     General: No scleral icterus.    Extraocular  Movements: EOM normal.     Conjunctiva/sclera: Conjunctivae normal.  Cardiovascular:     Rate and Rhythm: Normal rate and regular rhythm.     Pulses: Normal pulses.  Pulmonary:     Effort: Pulmonary effort is normal. No respiratory distress.     Breath sounds: No stridor. No wheezing.     Comments: Respirations even and unlabored. Lungs CTAB. Musculoskeletal:        General: Normal range of motion.     Cervical back: Normal range of motion.  Skin:    General: Skin is warm and dry.     Coloration: Skin is not pale.     Findings: No erythema or rash.  Neurological:     Mental Status: She is alert and oriented to person, place, and time.     Coordination: Coordination normal.  Psychiatric:        Mood and Affect: Mood and affect normal.        Behavior: Behavior normal.     ED Results / Procedures / Treatments   Labs (all labs ordered are listed, but only abnormal results are displayed) Labs Reviewed  CBC - Abnormal; Notable for the following components:      Result Value   RBC 5.41 (*)    All other components within normal limits  BASIC METABOLIC PANEL  URINALYSIS, ROUTINE W REFLEX MICROSCOPIC  CBG MONITORING, ED  I-STAT BETA HCG BLOOD, ED ([facility], [facility], [facility] ONLY)    EKG EKG Interpretation  Date/Time:  Thursday June 15 2020 01:02:20 EST Ventricular Rate:  78 PR Interval:    QRS Duration: 87 QT Interval:  382 QTC Calculation: 436 R Axis:   35 Text Interpretation: Sinus rhythm Confirmed by Thayer Jew [phone]) on 06/15/2020 1:04:20 AM   Radiology No results found.  Procedures Procedures (including critical care time)  Medications Ordered in ED Medications - No data to display  ED Course  I have reviewed the triage vital signs and the nursing notes.  Pertinent labs & imaging results that were available during my care of the patient were reviewed by me and considered in my medical decision making (see chart for details).  Clinical Course as of 06/15/20 0110   Thu Jun 15, 2020  9892 Orthostatic VS are reassuring. Will repeat EKG given fast rate; however, suspect patient to be stable for discharge if no acute changes to EKG. [KH]    Clinical Course User Index [KH] Beverely Pace   MDM Rules/Calculators/A&P                          35 year old female presents to the emergency department for evaluation of persistent body aches and lightheadedness in the setting of COVID-19.  She has had decreased oral intake since her diagnosis.  Her lightheadedness is very positional.  She has not experienced any syncope.  Her orthostatic vital signs in the emergency department are reassuring.  Symptom have improved while in the waiting room.  Patient able to tolerate PO fluids; no N/V.  Discussed expected disease progression of COVID as well as supportive care.  Stable for discharge at this time.  Return precautions discussed and provided. Patient discharged in stable condition with no unaddressed concerns.Allison Roman was evaluated in Emergency Department on 07/01/2020 for the symptoms described in the history of present illness. She was evaluated in the context of the global COVID-19 pandemic, which necessitated consideration that the patient might be at risk for infection with the SARS-CoV-2 virus that causes COVID-19. Institutional protocols and algorithms that pertain to the evaluation of patients at risk for COVID-19 are in a state of rapid change based on information released by regulatory bodies including the CDC and federal and state organizations. These policies and algorithms were followed during the patient's care in the ED.   Final Clinical Impression(s) / ED Diagnoses Final diagnoses:  Orthostatic lightheadedness  History of COVID-19    Rx / DC Orders ED Discharge Orders    None       Antony Madura, PA-C 07/01/20 1610    Shon Baton, MD 07/03/20 [phone]

## 2020-06-15 LAB — CBG MONITORING, ED: Glucose-Capillary: 82 mg/dL (ref 70–99)

## 2020-06-15 NOTE — Discharge Instructions (Addendum)
Your work up in the ED has been reassuring. Drink plenty of fluids to prevent dehydration. You may continue to use other over-the-counter remedies for symptom control for COVID, if desired. Return for new or concerning symptoms such as worsening shortness of breath, coughing up blood, persistent vomiting, loss of consciousness.

## 2020-08-08 IMAGING — DX DG CHEST 2V
2 series · 2 of 2 positions shown · non-contrast
Comparison: None.

CLINICAL DATA: Shortness of breath, chest pain

EXAM:
CHEST - 2 VIEW

[chest pa]
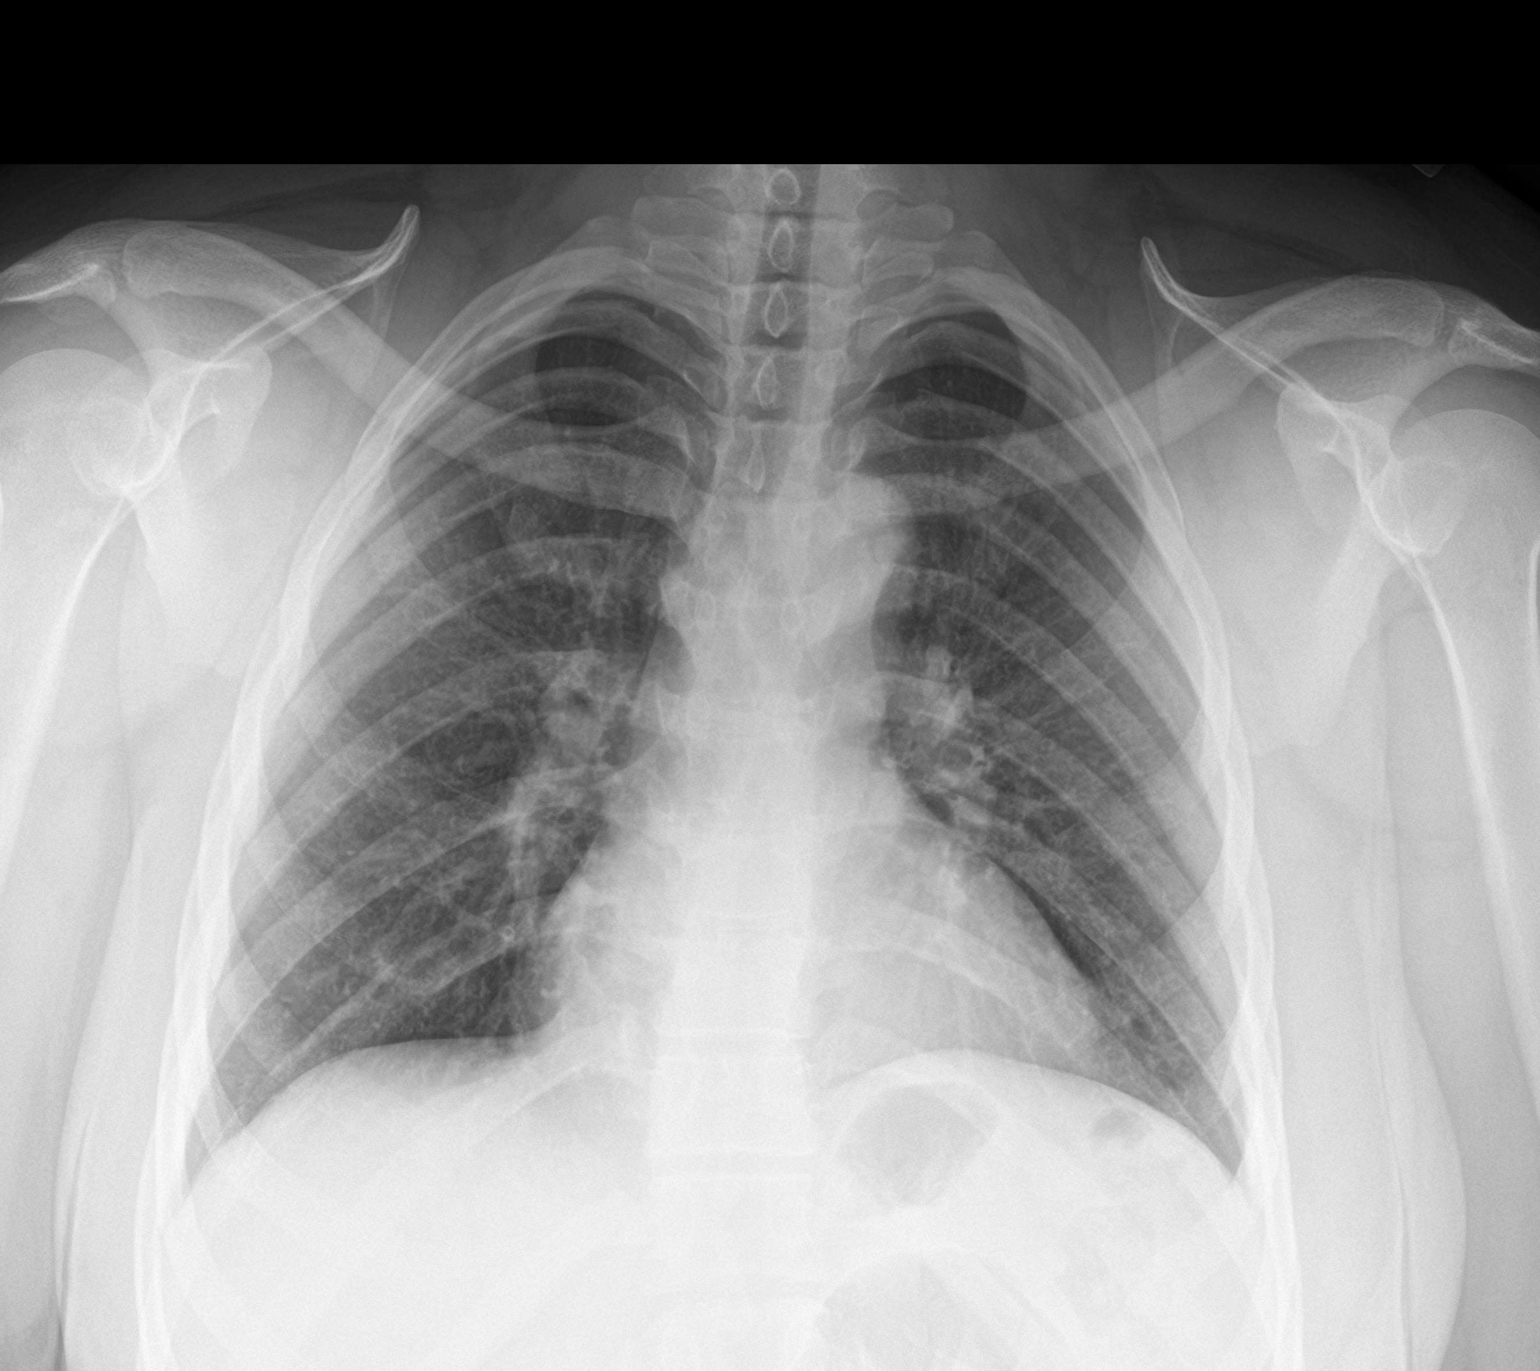

[chest lat]
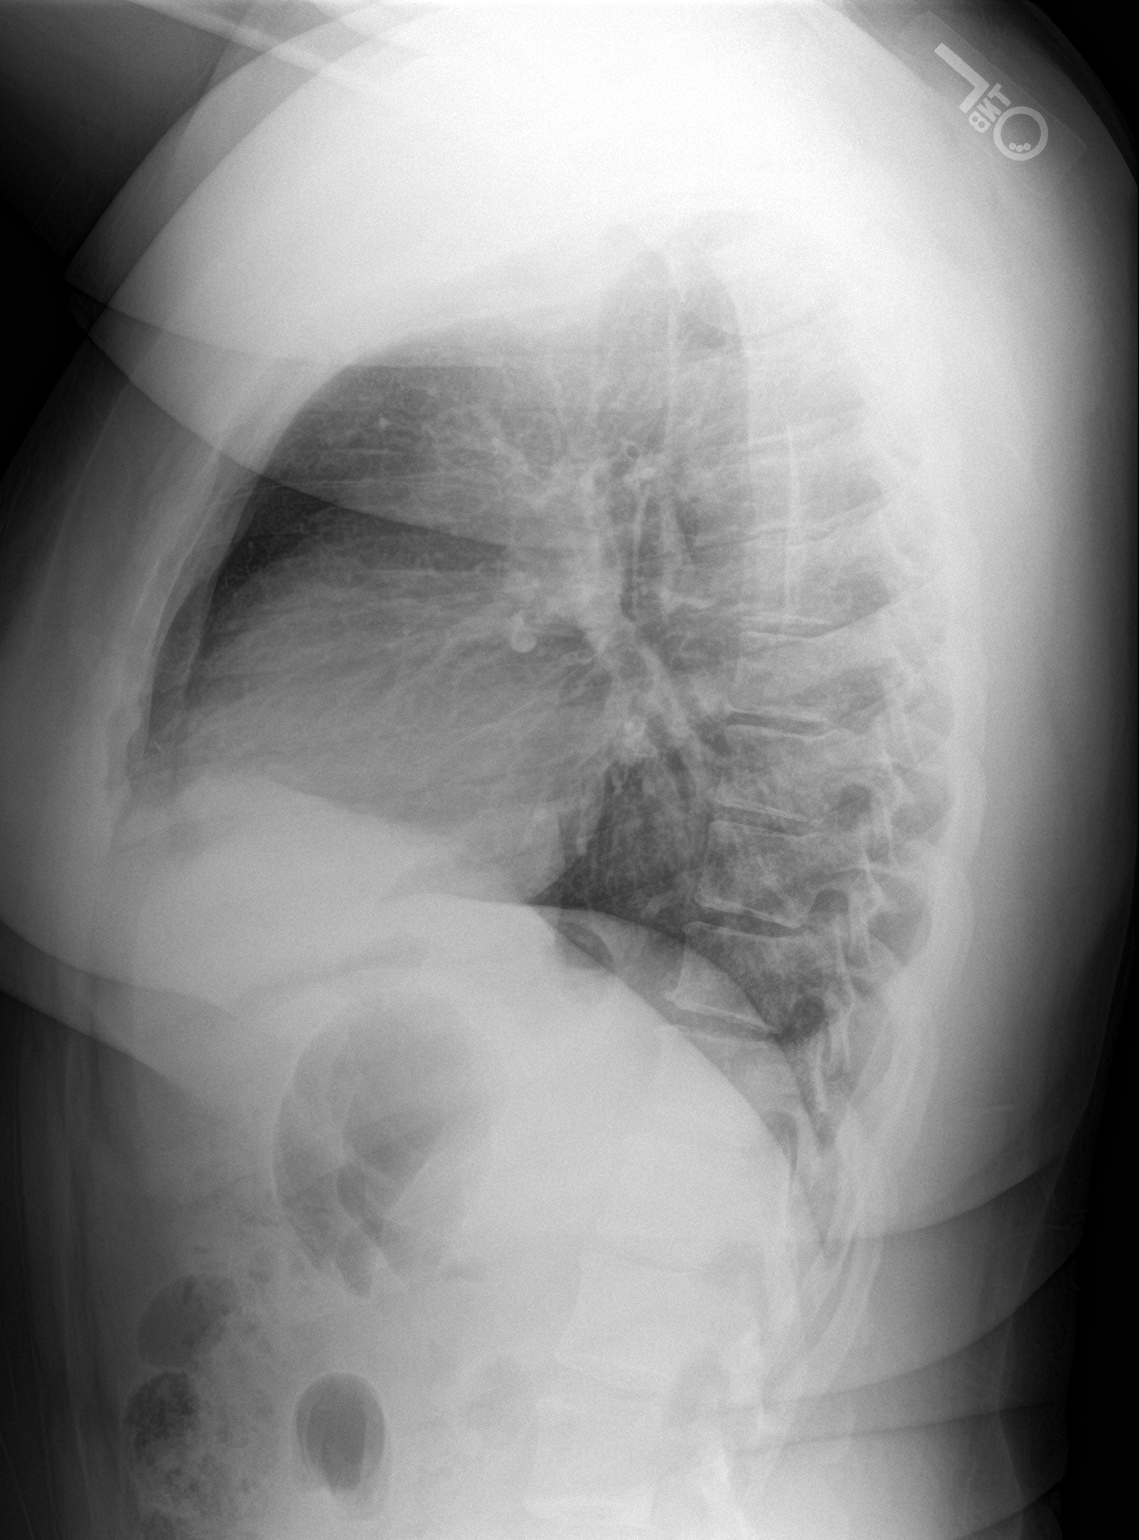

[2 of 2 positions shown; findings below may reference images not displayed]

FINDINGS: Heart and mediastinal contours are within normal limits. No focal
opacities or effusions. No acute bony abnormality. Mild
peribronchial thickening.
IMPRESSION: Mild bronchitic changes.
